# Patient Record
Sex: Female | Born: 1950 | Race: White | Hispanic: No | Marital: Married | Smoking: Never smoker
Health system: Southern US, Academic
[De-identification: ages and names within clinical notes are randomized; demographics above are authoritative.]

## PROBLEM LIST (undated history)

## (undated) DIAGNOSIS — R739 Hyperglycemia, unspecified: Secondary | ICD-10-CM

## (undated) DIAGNOSIS — K589 Irritable bowel syndrome without diarrhea: Secondary | ICD-10-CM

## (undated) DIAGNOSIS — E559 Vitamin D deficiency, unspecified: Secondary | ICD-10-CM

## (undated) DIAGNOSIS — H547 Unspecified visual loss: Secondary | ICD-10-CM

## (undated) DIAGNOSIS — F32A Depression, unspecified: Secondary | ICD-10-CM

## (undated) DIAGNOSIS — L259 Unspecified contact dermatitis, unspecified cause: Secondary | ICD-10-CM

## (undated) DIAGNOSIS — K5792 Diverticulitis of intestine, part unspecified, without perforation or abscess without bleeding: Secondary | ICD-10-CM

## (undated) DIAGNOSIS — I491 Atrial premature depolarization: Secondary | ICD-10-CM

## (undated) DIAGNOSIS — E7800 Pure hypercholesterolemia, unspecified: Secondary | ICD-10-CM

## (undated) DIAGNOSIS — I10 Essential (primary) hypertension: Secondary | ICD-10-CM

## (undated) DIAGNOSIS — F411 Generalized anxiety disorder: Secondary | ICD-10-CM

## (undated) DIAGNOSIS — E782 Mixed hyperlipidemia: Secondary | ICD-10-CM

## (undated) DIAGNOSIS — E119 Type 2 diabetes mellitus without complications: Secondary | ICD-10-CM

## (undated) DIAGNOSIS — H269 Unspecified cataract: Secondary | ICD-10-CM

## (undated) HISTORY — PX: HX CATARACT REMOVAL: SHX102

## (undated) HISTORY — DX: Generalized anxiety disorder: F41.1

## (undated) HISTORY — DX: Irritable bowel syndrome, unspecified: K58.9

## (undated) HISTORY — DX: Unspecified visual loss: H54.7

## (undated) HISTORY — DX: Vitamin D deficiency, unspecified: E55.9

## (undated) HISTORY — DX: Pure hypercholesterolemia, unspecified: E78.00

## (undated) HISTORY — PX: HX LAP CHOLECYSTECTOMY: SHX56

## (undated) HISTORY — DX: Unspecified contact dermatitis, unspecified cause: L25.9

## (undated) HISTORY — DX: Diverticulitis of intestine, part unspecified, without perforation or abscess without bleeding: K57.92

## (undated) HISTORY — DX: Unspecified cataract: H26.9

## (undated) HISTORY — PX: SHOULDER SURGERY: SHX246

## (undated) HISTORY — PX: HX PARTIAL HYSTERECTOMY: SHX80

## (undated) HISTORY — DX: Essential (primary) hypertension: I10

## (undated) HISTORY — DX: Hyperglycemia, unspecified: R73.9

## (undated) HISTORY — DX: Mixed hyperlipidemia: E78.2

## (undated) HISTORY — DX: Hypomagnesemia: E83.42

## (undated) HISTORY — PX: KNEE SURGERY: SHX244

## (undated) HISTORY — DX: Type 2 diabetes mellitus without complications: E11.9

## (undated) HISTORY — DX: Atrial premature depolarization: I49.1

## (undated) HISTORY — DX: Depression, unspecified: F32.A

## (undated) NOTE — Progress Notes (Signed)
 Formatting of this note might be different from the original.  Subjective   Patient ID: Jeanne [name] is a 68 y.o. female presenting to the Urgent Care with a chief complaint of Other (Pt. States she has a flutter in left ear x 2 months).    She has been hearing a fluttering sound in her left ear several times per day for the past 2 months or so. She feels like her hearing has been decreased in that ear as well. No ringing in ear. She does often also have dizziness but she figured that could be a medication side effect or just due to her age. No pain in ear, no tenderness, no drainage from ear, no headache, no nausea or vomiting, no fever or chills. No evaluation or treatment so far. No prior history of ear problems.    Objective   BP (!) 192/81 (BP Location: Right arm, Patient Position: Sitting, BP Cuff Size: Adult)   Pulse 65   Temp 36.3 C (97.4 F) (Temporal)   Resp 18   Ht 1.549 m (5' 1)   Wt 96.2 kg (212 lb)   SpO2 96%   BMI 40.06 kg/m     Physical Exam  Vitals and nursing note reviewed.   Constitutional:       General: She is not in acute distress.  HENT:      Right Ear: Hearing, tympanic membrane, ear canal and external ear normal.      Left Ear: Hearing, tympanic membrane, ear canal and external ear normal.      Mouth/Throat:      Lips: No lesions.   Eyes:      Extraocular Movements: Extraocular movements intact.      Conjunctiva/sclera: Conjunctivae normal.   Cardiovascular:      Rate and Rhythm: Normal rate.   Pulmonary:      Effort: Pulmonary effort is normal.   Skin:     Findings: No rash.   Neurological:      Mental Status: She is alert.         Assessment & Plan    Assessment & Plan  Hearing loss of left ear, unspecified hearing loss type    Orders:    Ambulatory referral to ENT; Future        In-House Lab Results:   No results found for this or any previous visit.     In-House Imaging Reads:        Procedure Documentation:  Procedures     ED Course & MDM   MDM - Medical Decision Making:  Differential diagnosis considered include: AOM, OE, cerumen impaction, foreign body, sensorineural hearing loss  Electronically signed by Lonni DELENA Potters, MD at 07/01/2024  4:31 PM EDT

---

## 1981-09-25 HISTORY — PX: HX HYSTERECTOMY: SHX81

## 2019-03-12 DIAGNOSIS — H2510 Age-related nuclear cataract, unspecified eye: Secondary | ICD-10-CM | POA: Insufficient documentation

## 2019-03-12 DIAGNOSIS — Z794 Long term (current) use of insulin: Secondary | ICD-10-CM | POA: Insufficient documentation

## 2019-03-12 DIAGNOSIS — H25041 Posterior subcapsular polar age-related cataract, right eye: Secondary | ICD-10-CM | POA: Insufficient documentation

## 2021-12-09 HISTORY — PX: INCONTINENCE SURGERY: SHX676

## 2022-01-03 ENCOUNTER — Encounter (RURAL_HEALTH_CENTER): Payer: Self-pay | Admitting: NURSE PRACTITIONER

## 2022-01-05 ENCOUNTER — Other Ambulatory Visit: Payer: Self-pay

## 2022-01-05 ENCOUNTER — Ambulatory Visit: Payer: Medicare Other | Attending: NURSE PRACTITIONER | Admitting: NURSE PRACTITIONER

## 2022-01-05 ENCOUNTER — Encounter (RURAL_HEALTH_CENTER): Payer: Self-pay | Admitting: NURSE PRACTITIONER

## 2022-01-05 VITALS — BP 132/76 | HR 85 | Temp 96.9°F | Resp 17 | Ht 61.0 in | Wt 227.0 lb

## 2022-01-05 DIAGNOSIS — Z961 Presence of intraocular lens: Secondary | ICD-10-CM | POA: Insufficient documentation

## 2022-01-05 DIAGNOSIS — H2640 Unspecified secondary cataract: Secondary | ICD-10-CM | POA: Insufficient documentation

## 2022-01-05 DIAGNOSIS — E782 Mixed hyperlipidemia: Secondary | ICD-10-CM | POA: Insufficient documentation

## 2022-01-05 DIAGNOSIS — Z7984 Long term (current) use of oral hypoglycemic drugs: Secondary | ICD-10-CM | POA: Insufficient documentation

## 2022-01-05 DIAGNOSIS — E1165 Type 2 diabetes mellitus with hyperglycemia: Secondary | ICD-10-CM | POA: Insufficient documentation

## 2022-01-05 DIAGNOSIS — Z794 Long term (current) use of insulin: Secondary | ICD-10-CM | POA: Insufficient documentation

## 2022-01-05 DIAGNOSIS — I252 Old myocardial infarction: Secondary | ICD-10-CM | POA: Insufficient documentation

## 2022-01-05 DIAGNOSIS — I10 Essential (primary) hypertension: Secondary | ICD-10-CM | POA: Insufficient documentation

## 2022-01-05 MED ORDER — INSULIN LISPRO (U-100) 100 UNIT/ML SUBCUTANEOUS PEN
PEN_INJECTOR | SUBCUTANEOUS | 1 refills | Status: DC
Start: 2022-01-05 — End: 2022-04-25

## 2022-01-05 MED ORDER — GVOKE HYPOPEN 2-PACK 1 MG/0.2 ML SUBCUTANEOUS AUTO-INJECTOR
AUTO-INJECTOR | SUBCUTANEOUS | 2 refills | Status: DC
Start: 2022-01-05 — End: 2022-02-28

## 2022-01-05 MED ORDER — PEN NEEDLE, DIABETIC 32 GAUGE X 5/32"
2 refills | Status: DC
Start: 2022-01-05 — End: 2022-07-27

## 2022-01-05 MED ORDER — INSULIN DEGLUDEC (U-200) 200 UNIT/ML (3 ML) SUBCUTANEOUS PEN
PEN_INJECTOR | SUBCUTANEOUS | 1 refills | Status: DC
Start: 2022-01-05 — End: 2022-04-25

## 2022-01-05 NOTE — Patient Instructions (Addendum)
-  Stop Glipizide  -Stop Humalog 75/25  -Start Tresiba 34 units once daily  -Start Humalog 7 units with meals, take 5-15 minutes before the meal or right when you sit down to eat  -Start Humalog correction scale with meals of 2 units for every 50 points your blood sugar is greater than 150, see scale below  -Check your sugar fasting, before meals, bedtime and as needed and record and send in glucose log to [place] in 2-3 weeks  -We will send in paperwork for personal Dexcom continuous glucose monitor  -Have labs drawn as ordered  -Follow up with Lowella Bandy in 3 months  -Call for any questions or concerns  -Call for 2 or more blood sugars less than 80 in one week, or any noted pattern of high or low blood sugars          Humalog mealtime scale  If your blood sugar is greater than 150 before the meal please use your prescribed fast acting insulin (NovoLog, Humalog, Apidra) for every 50 mg/dL above 767  Blood sugar:           150-200, 2 units                                  201-250, 4 units                                  251-300, 6 units                                  301-350, 8 units                                  351-400, 10 units                                  Greater than 400, 12 units        Hypoglycemia (Low Blood Sugar/ Under 80)  How to avoid hypoglycemia:  1. Eat meals and snacks on time  2. Check blood sugar regularly  3. Be mindful of activity and ALWAYS check blood sugar before activity/exercise and before driving  4. ALWAYS have a fast acting source of glucose with you at all times (glucose tablets, glucose gel, smarties, hard candy, or juice box)  TREATMENT  1. Test Blood Sugar at the fingertip - if unable to test blood sugar - treat like it is low  2. Treatment - Rule of 15 eat or drink 15 grams of fast acting carbs (example: 1/2 cup of juice or regular soda, 1 cup low fat or skim milk, 4 glucose tablets, 6 lifesavers)  *If unable to eat use glucagon injection AND call 911  3. Recheck blood sugar in 15  minutes  If blood sugar is less than 80- repeat rule of 15 (as above)  If blood sugar is greater than 80 - be mindful, continue checking blood sugar

## 2022-01-05 NOTE — Progress Notes (Signed)
[place] [facility]  FAMILY MEDICINE, Jeanne [name]  [address] [place]  [place] [address]  [phone]   Progress Note    Date of Service:  01/09/2022  Jeanne [name], Jeanne [name], 71 y.o. female  Date of Birth:  December 25, 1950  PCP: Jeanne [name]     HPI:  Jeanne [name] presents to Endocrinology clinic for a new consult for Type 2 diabetes complicated by other comorbid conditions including: hypertension, hyperlipidemia and obesity. Diagnosed with diabetes in 1997, at age 54. Family history is Positive for Type 2 diabetes- father.  Most recent A1c is 8.4 - indicative of suboptimal glycemic control.  Pt reports checking her blood sugars 2-4 times daily, the following is from pts recollection as she did not bring glucose log or glucometer with her:  Fasting: 140's-160s  Bedtime: 200s  Current diabetes medications include: Glipizide 10 mg once daily; Humalog 75/25 60 units Breakfast and 10 units PM  Does not  miss medications doses.  Denies difficulty affording medication and/or test strips.   Previous diabetes medication include: Metformin (nausea, diarrhea- even with extended release even with lower doses); Trulicity (nausea); Lantus (rash)  Has glucagon at home: No  Diabetes Education in the past: No RD in the past - No  Lives with: family  Meals:  Breakfast- eggs, potatoes, tomatoes, pancakes, toast-white bread, tea with sugar  Lunch- eating out a lot- burgers, salads  Dinner- vegetables, potatoes, some meat, pasta  Snacks- apples, celery, cheese, peanut butter, pepperoni, crackers  Drinks- Coke-1 16 oz bottle per day; water  Activity/Exercise: denies at this time, will start doing physical therapy shortly  Weight has: increased  Any hospitalizations r/t to diabetes over the last year: No  Chronic Complications:  Microvascular - no known history of retinopathy, nephropathy, positive for neuropathy   Macrovascular- positive hx of MI, negative for CVA and TIA  Autonomic  Dysfunction - denies gastroparesis, or bowel/bladder dysfunction  Denies foot ulcers, deformities. Does perform foot care at home - follows with podiatry in [place] every 3 months  Experiences hypoglycemia: Yes. Symptoms of hypoglycemia present during an event: lethargy, sweats and shakes Experiences hypoglycemia: rarely  Up to date with opthamology exam: Yes, 09/2021, Dr. Marinda Elk, [place], [place]  Steroids recently: No  History of frequent UTI: No  History of frequent genital yeast infection: No  History of pancreatitis: No  Personal or family Multiple Endocrine Neoplasia Type 2 (MEN2): No     Jeanne [name] is very interested in personal CGM with Dexcom which would be of great benefit to her.    Jeanne [name] is on 4 injections of insulin daily.  She requires personal CGM so that she can make the needed and frequent changes to her insulin regimen for improved glycemic control.    I have personally reviewed allergies, chief complaint, medications, past medical history, surgical history, and family history.  I have reviewed referral notes, PCP progress notes and labs relevant to this visit.    Past Medical History:   Diagnosis Date   . Clouded crystalline lens    . Contact dermatitis and eczema    . Depressive disorder    . Diverticulitis    . Generalized anxiety disorder    . High cholesterol    . HTN (hypertension)    . Hyperglycemia    . Hypomagnesemia    . IBS (irritable bowel syndrome)    . Mixed hyperlipidemia    . Premature atrial contraction    . T2DM (type 2  diabetes mellitus) (CMS HCC)    . Unspecified cataract    . Vision loss    . Vitamin D deficiency       Past Surgical History:   Procedure Laterality Date   . CESAREAN SECTION     . HX CATARACT REMOVAL     . HX HYSTERECTOMY  1983   . HX LAP CHOLECYSTECTOMY     . HX PARTIAL HYSTERECTOMY     . INCONTINENCE SURGERY  12/09/2021   . KNEE SURGERY      Knee replacements   . SHOULDER SURGERY        Social History     Tobacco Use   . Smoking status: Never     Passive exposure:  Never   . Smokeless tobacco: Never   Vaping Use   . Vaping Use: Never used   Substance Use Topics   . Alcohol use: Never   . Drug use: Never       Family Medical History:     Problem Relation (Age of Onset)    Anesthesia Complications Mother    Asthma Brother    Cancer Mother    Cervical Cancer Mother    Diabetes Father    Hypertension (High Blood Pressure) Father    Lung disease Father         Outpatient Medications Marked as Taking for the 01/05/22 encounter (Office Visit) with Jeanne China, APRN   Medication Sig   . celecoxib (CELEBREX) 200 mg Oral Capsule celecoxib 200 mg capsule   TAKE 1 CAPLET BY MOUTH EVERY MORNING - TAKE AFTER FOOD   . ergocalciferol, vitamin D2, (DRISDOL) 1,250 mcg (50,000 unit) Oral Capsule Vitamin D2 1,250 mcg (50,000 unit) capsule   Take 1 capsule every week by oral route for 84 days.   Marland Kitchen glucagon (GVOKE HYPOPEN 2-PACK) 1 mg/0.2 mL Subcutaneous Auto-Injector To inject IM or SubQ for severe hypoglycemia, may repeat in 15 minutes prn   . insulin degludec 200 unit/mL (3 mL) Subcutaneous Insulin Pen Inject 34 units once daily, adjust as directed, MDD 60   . insulin lispro (HUMALOG KWIKPEN INSULIN) 100 unit/mL Subcutaneous Insulin Pen Inject 7 units with meals plus correction 2 units for every 50 greater than 150 with meals, MDD 50   . losartan (COZAAR) 50 mg Oral Tablet Take 1 Tablet (50 mg total) by mouth Once a day   . magnesium oxide (MAG-OX) 400 mg Oral Tablet daily   . Pen Needle, Disposable, 32 gauge x 5/32" Needle To use 4 times daily with insulin   . sertraline (ZOLOFT) 100 mg Oral Tablet sertraline 100 mg tablet   . torsemide (DEMADEX) 10 mg Oral Tablet Take 1 Tablet (10 mg total) by mouth Once a day      Allergies   Allergen Reactions   . Penicillins Anaphylaxis   . Insulin Glargine Rash and Hives/ Urticaria   . Statins-Hmg-Coa Reductase Inhibitors Diarrhea, Nausea/ Vomiting and Myalgia     Duricef(Cefadroxil), Trulicity Solution Pen Injector.        Review of Systems:  Review of  Systems   Constitutional: Negative for malaise/fatigue.   Eyes: Negative for blurred vision.   Respiratory: Negative for shortness of breath.    Cardiovascular: Negative for chest pain and leg swelling.   Gastrointestinal: Negative for abdominal pain, diarrhea, nausea and vomiting.   Genitourinary: Negative for frequency.   Endo/Heme/Allergies: Negative for polydipsia.          BP 132/76   Pulse 85  Temp 36.1 C (96.9 F) (Temporal)   Resp 17   Ht 1.549 m (5\' 1" )   Wt 103 kg (227 lb)   SpO2 95%   BMI 42.89 kg/m       Physical Exam  Vitals reviewed.   Constitutional:       Appearance: Normal appearance. She is obese.   HENT:      Head: Normocephalic.   Neck:      Vascular: No carotid bruit.   Cardiovascular:      Rate and Rhythm: Normal rate and regular rhythm.   Pulmonary:      Effort: Pulmonary effort is normal.      Breath sounds: Normal breath sounds.   Musculoskeletal:      Cervical back: Neck supple.   Skin:     General: Skin is warm and dry.   Neurological:      General: No focal deficit present.      Mental Status: She is alert and oriented to person, place, and time.            Data Reviewed:    Outside Lab Results 09/23/21:  HgbA1C; 8.4  GFR: 100  GFR: 0.5  Albumin/creatinine: 56.3    Outside Lab Results 04/14/21:  LDL: 132  Triglycerides: 117  TSH: 2.48    Assessment/Plan:  Assessment/Plan   1. Type 2 diabetes mellitus with hyperglycemia, with long-term current use of insulin (CMS HCC)    2. Essential hypertension    3. Mixed hyperlipidemia         ICD-10-CM    1. Type 2 diabetes mellitus with hyperglycemia, with long-term current use of insulin (CMS HCC)  E11.65 insulin degludec 200 unit/mL (3 mL) Subcutaneous Insulin Pen    Z79.4 insulin lispro (HUMALOG KWIKPEN INSULIN) 100 unit/mL Subcutaneous Insulin Pen     Pen Needle, Disposable, 32 gauge x 5/32" Needle     MICROALBUMIN/CREATININE RATIO, URINE, RANDOM     COMPREHENSIVE METABOLIC PANEL, NON-FASTING     HGA1C (HEMOGLOBIN A1C WITH EST AVG  GLUCOSE)     LIPID PANEL     glucagon (GVOKE HYPOPEN 2-PACK) 1 mg/0.2 mL Subcutaneous Auto-Injector      2. Essential hypertension  I10       3. Mixed hyperlipidemia  E78.2          Orders Placed This Encounter   . MICROALBUMIN/CREATININE RATIO, URINE, RANDOM   . COMPREHENSIVE METABOLIC PANEL, NON-FASTING   . HGA1C (HEMOGLOBIN A1C WITH EST AVG GLUCOSE)   . LIPID PANEL   . insulin degludec 200 unit/mL (3 mL) Subcutaneous Insulin Pen   . insulin lispro (HUMALOG KWIKPEN INSULIN) 100 unit/mL Subcutaneous Insulin Pen   . Pen Needle, Disposable, 32 gauge x 5/32" Needle   . glucagon (GVOKE HYPOPEN 2-PACK) 1 mg/0.2 mL Subcutaneous Auto-Injector      Return in about 3 months (around 04/06/2022).    Type 2 diabetes - reviewed labs with patient. Discussed difficulty in controlling diabetes with mixed insulin.  She is agreeable to trying basal/bolus therapy.  Will stop Glipizide.  Start Tresiba 34 units once daily.  Start Humalog 7 units with meals plus correction 2:50 >150 AC.  To check glucose fasting, AC, HS and prn.  Send in glucose log in 2-3 weeks.  Will send in for personal Dexcom.  Hypertension - at goal, continue current regimen and continue to monitor  Hyperlipidemia - LDL not at goal, intolerant to statins d/t myalgias  Neuropathy - reviewed importance of foot care and proper  shoes, follows with podiatry regularly  Obesity - benefits of weight loss with emphasis on lowering insulin resistance reviewed, recommend following meal plan and increasing activity      On the day of the encounter, a total of  60 minutes was spent on this patient encounter including review of historical information, examination, documentation and post-visit activities.      This note may have been partially generated using MModal Fluency Direct system, and there may be some incorrect words, spellings, and punctuation that were not noted in checking the note before saving.    Jeanne China, APRN

## 2022-01-09 ENCOUNTER — Encounter (RURAL_HEALTH_CENTER): Payer: Self-pay | Admitting: NURSE PRACTITIONER

## 2022-02-28 ENCOUNTER — Other Ambulatory Visit (RURAL_HEALTH_CENTER): Payer: Self-pay | Admitting: NURSE PRACTITIONER

## 2022-02-28 DIAGNOSIS — Z794 Long term (current) use of insulin: Secondary | ICD-10-CM

## 2022-02-28 NOTE — Telephone Encounter (Signed)
Patient needs glucagon sent to [facility].  Nmt, lpn

## 2022-03-01 ENCOUNTER — Other Ambulatory Visit (RURAL_HEALTH_CENTER): Payer: Self-pay | Admitting: NURSE PRACTITIONER

## 2022-03-01 DIAGNOSIS — E1165 Type 2 diabetes mellitus with hyperglycemia: Secondary | ICD-10-CM

## 2022-03-01 MED ORDER — GLUCAGON EMERGENCY KIT 1 MG SOLUTION FOR INJECTION
INTRAMUSCULAR | 2 refills | Status: DC
Start: 2022-03-01 — End: 2024-06-24

## 2022-03-06 IMAGING — MR MRI PELVIS WITHOUT CONTRAST
4 of 6 series · 22 of 48 positions shown · IV contrast (gadolinium)
Comparison: None available.

﻿EXAM:  [id number]   MRI PELVIS WITHOUT CONTRAST
INDICATION: Hip pain.
TECHNIQUE: Multiplanar multisequential MRI of the pelvis was performed without gadolinium contrast.

[Series 7: T2 fat-sat · axial · 4.9mm · 0.80mm/px · z∈[-27,+161]mm · 8 of 36 slices shown]
[im 1/36]
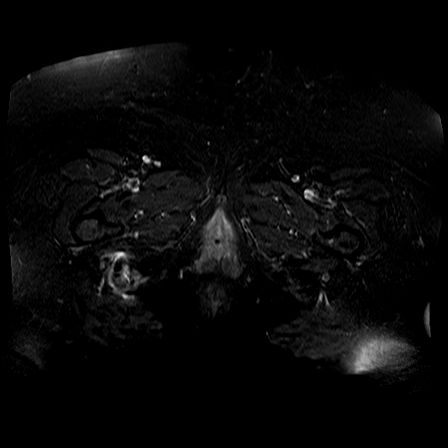
[im 4/36]
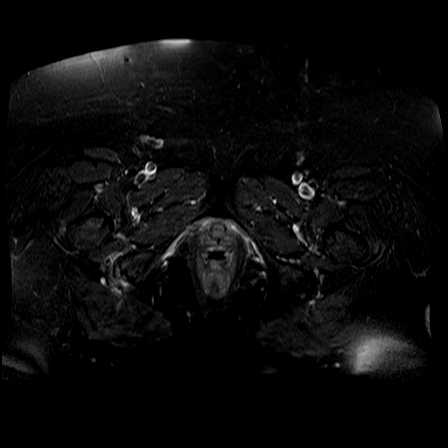
[im 12/36]
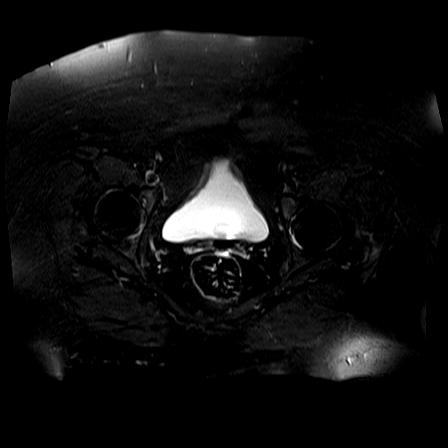
[im 16/36]
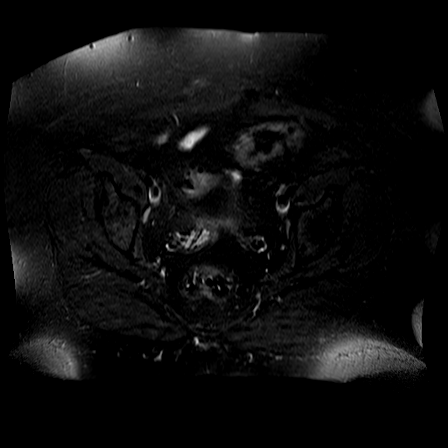
[im 20/36]
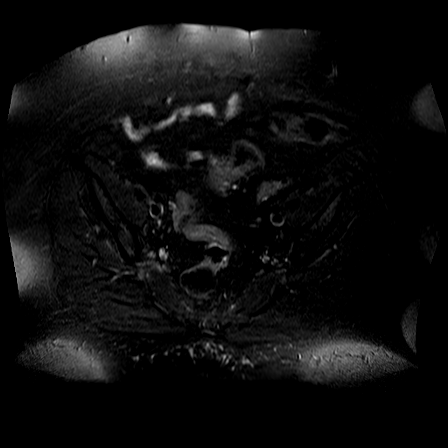
[im 24/36]
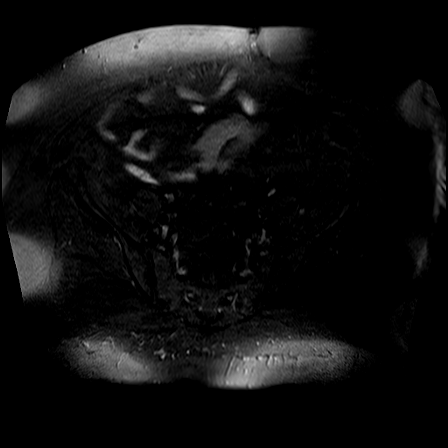
[im 32/36]
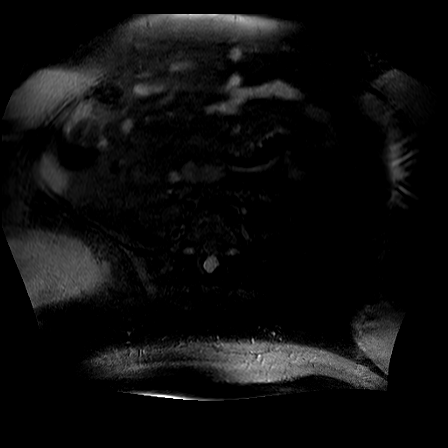
[im 36/36]
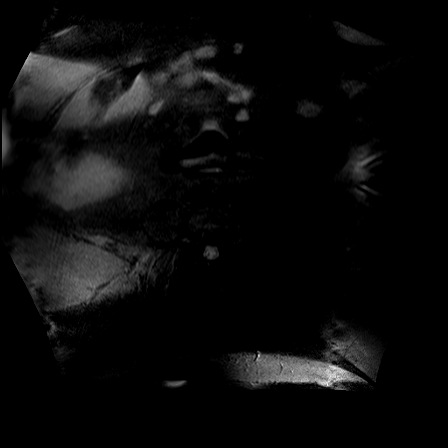

[Series 8: T1 · axial · 4.9mm · 0.70mm/px · z∈[-27,+134]mm · 8 of 36 slices shown (1 of 2)]
[im 1/36]
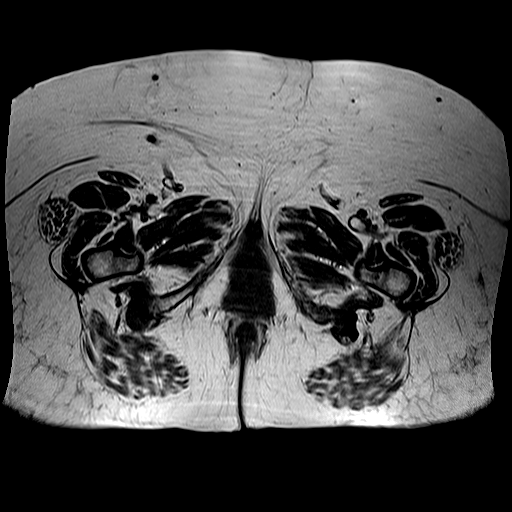
[im 5/36]
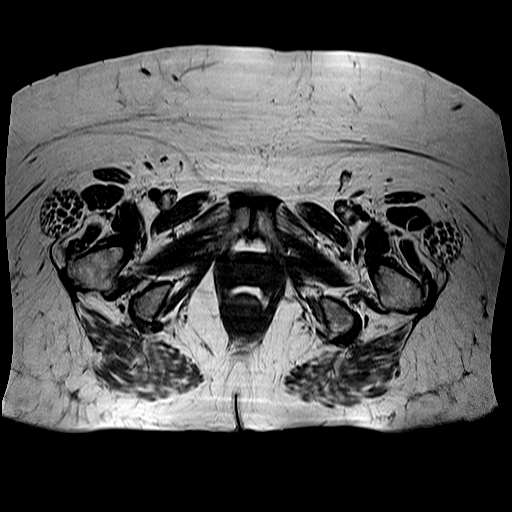
[im 9/36]
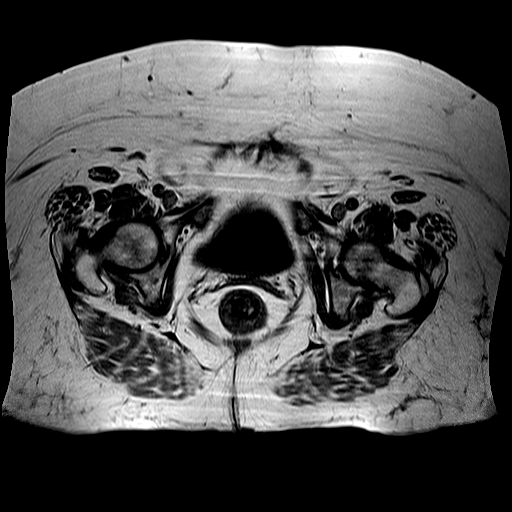
[im 14/36]
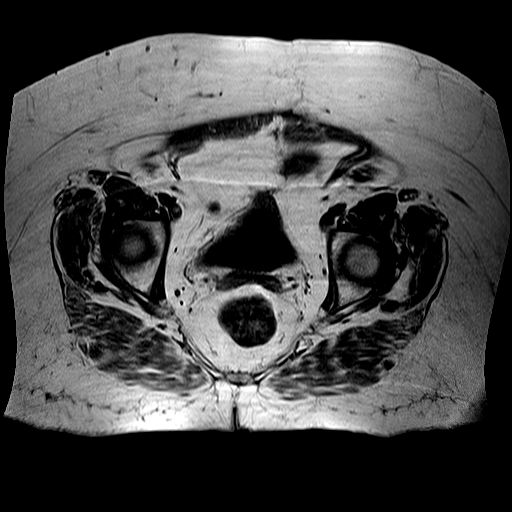
[im 18/36]
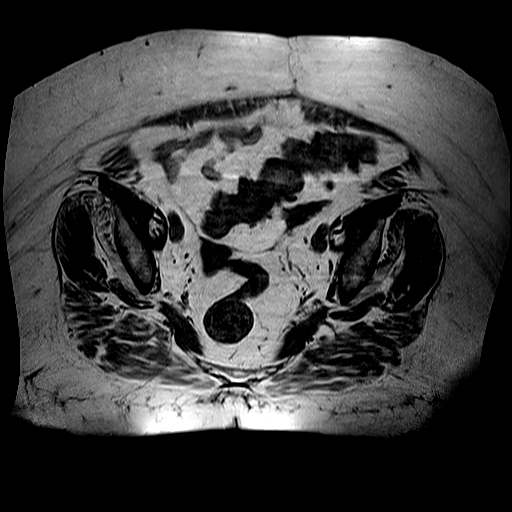
[im 22/36]
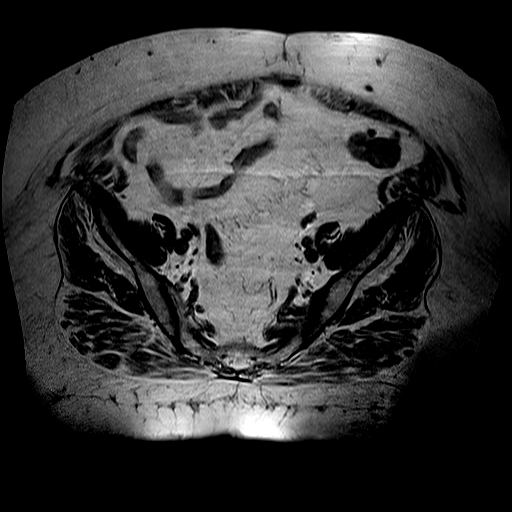
[im 27/36]
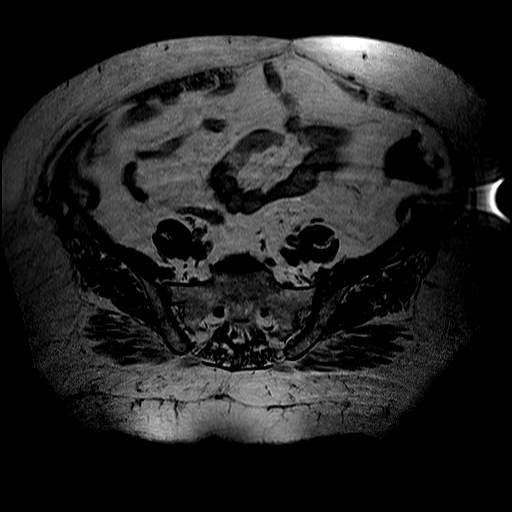
[im 31/36]
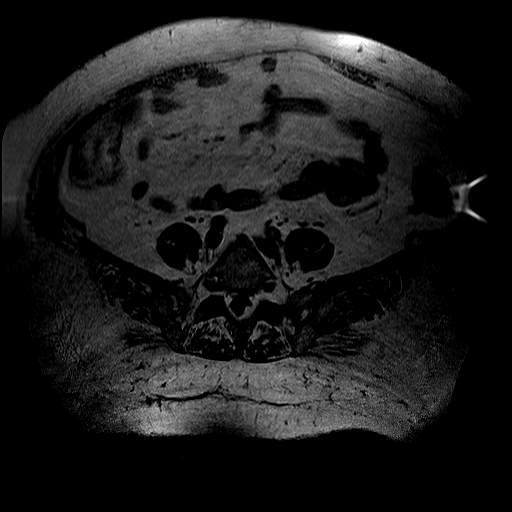

[Series 9: STIR · axial · 4.9mm · 0.70mm/px · z∈[-6,+134]mm · 3 of 36 slices shown]
[im 5/36]
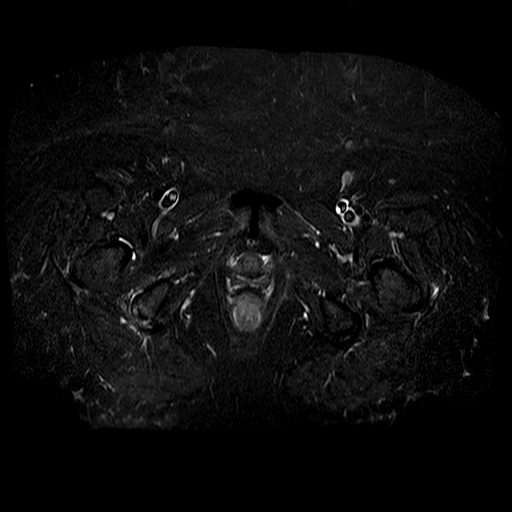
[im 18/36]
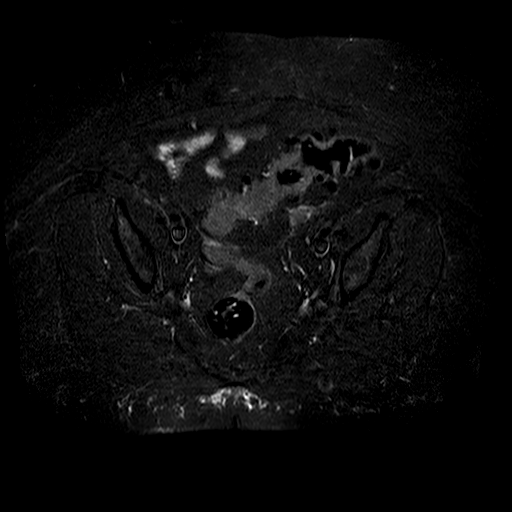
[im 31/36]
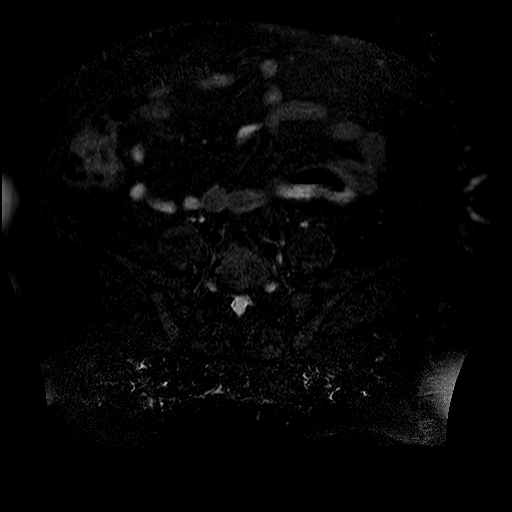

[Series 10: T1 · coronal · 6.0mm · 0.70mm/px · 3 of 26 slices shown (2 of 2)]
[im 6/26]
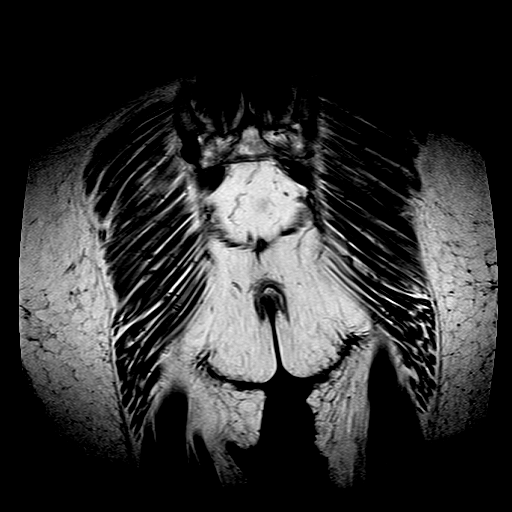
[im 16/26]
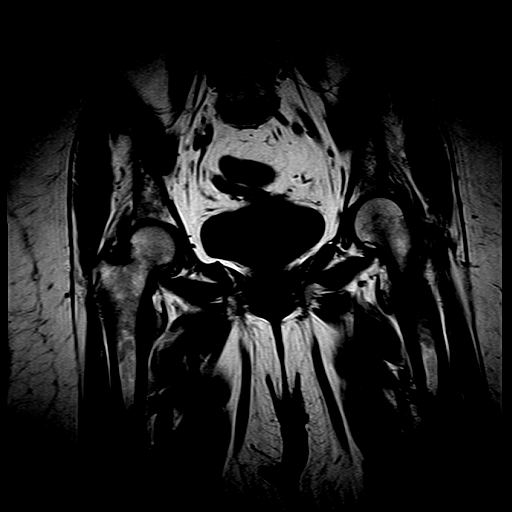
[im 26/26]
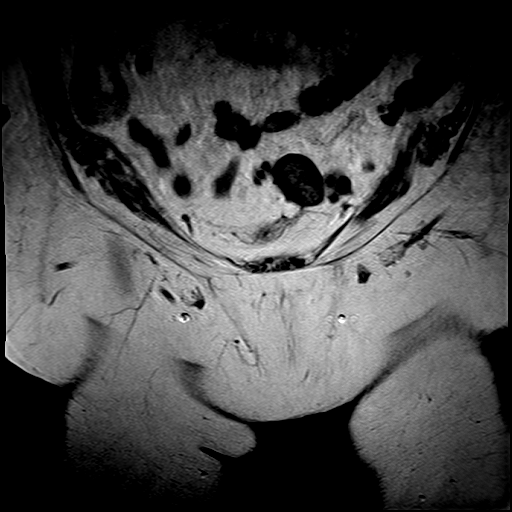

[22 of 48 positions shown; findings below may reference images not displayed]

FINDINGS: Bone marrow signal intensity is normal.  There is no acute fracture or subluxation.  Hip joints are well maintained.  There is no significant hip effusion on either side.  There is mild-to-moderate bilateral greater trochanteric bursitis.  There is severe right high hamstring tendinopathy.  Evaluation of pelvic parenchymal structures is limited without definite abnormality.
IMPRESSION: 1. Severe right high hamstring tendinopathy.  

2. Mild-to-moderate bilateral greater trochanteric bursitis.

## 2022-03-06 IMAGING — MR MRI LUMBAR SPINE WITHOUT CONTRAST
5 of 6 series · 32 of 48 positions shown · IV contrast (gadolinium)
Comparison: None available.

﻿EXAM:  [id number]   MRI LUMBAR SPINE WITHOUT CONTRAST
INDICATION: Lower back pain with radiculopathy.
TECHNIQUE: Multiplanar multisequential MRI of the lumbar spine was performed without gadolinium contrast.

[Series 5: T2 · sagittal · 4.0mm · 0.94mm/px · 6 of 13 slices shown (1 of 3)]
[im 1/13]
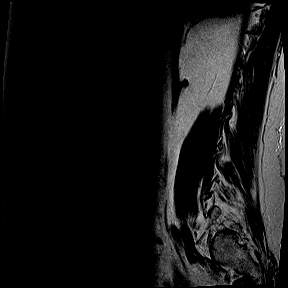
[im 3/13]
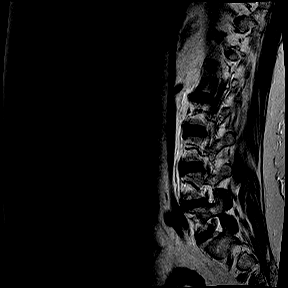
[im 5/13]
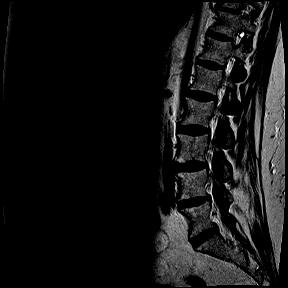
[im 8/13]
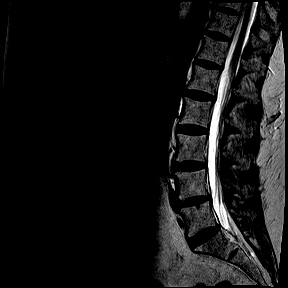
[im 10/13]
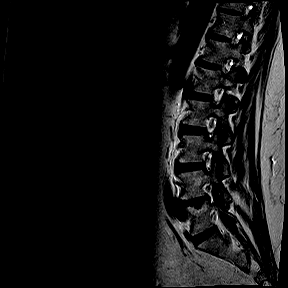
[im 13/13]
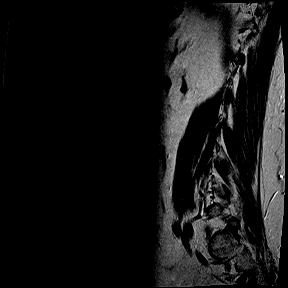

[Series 6: T1 · sagittal · 4.0mm · 0.94mm/px · 6 of 13 slices shown (1 of 2)]
[im 1/13]
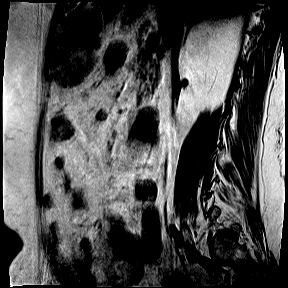
[im 3/13]
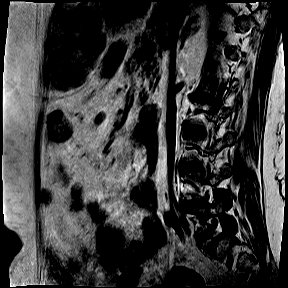
[im 5/13]
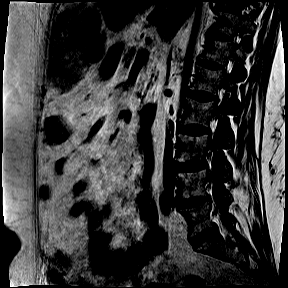
[im 8/13]
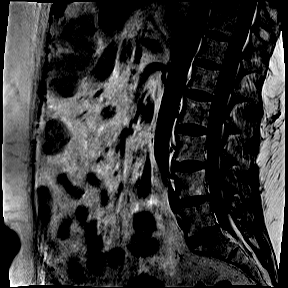
[im 10/13]
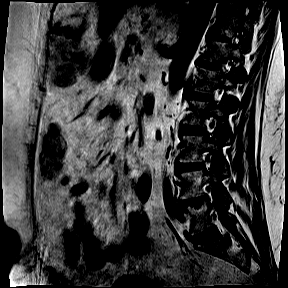
[im 13/13]
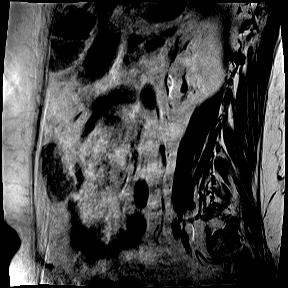

[Series 7: T2 · axial · 4.0mm · 0.52mm/px · z∈[-131,+65]mm · 11 of 23 slices shown (2 of 3)]
[im 1/23]
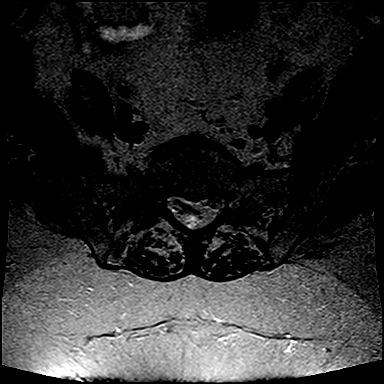
[im 3/23]
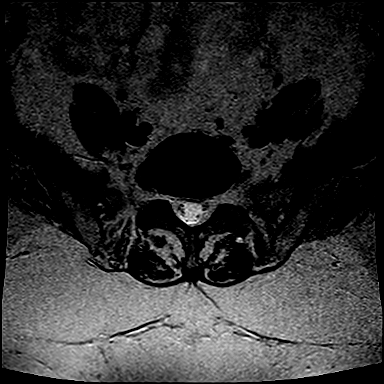
[im 5/23]
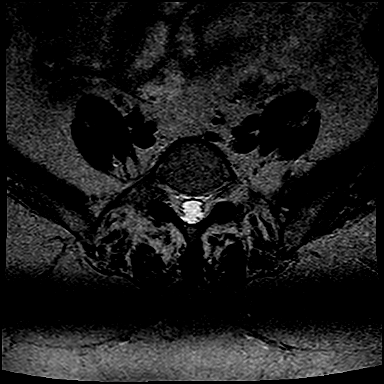
[im 7/23]
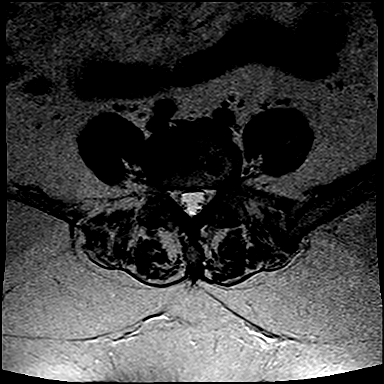
[im 9/23]
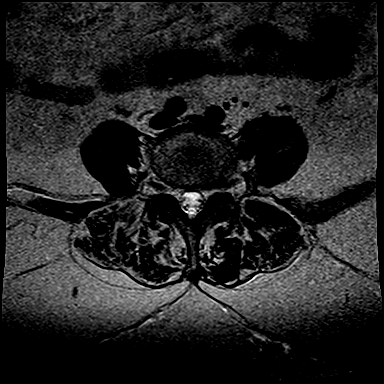
[im 12/23]
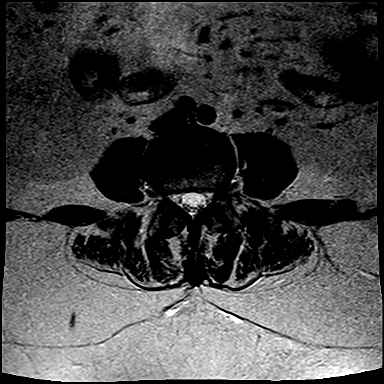
[im 14/23]
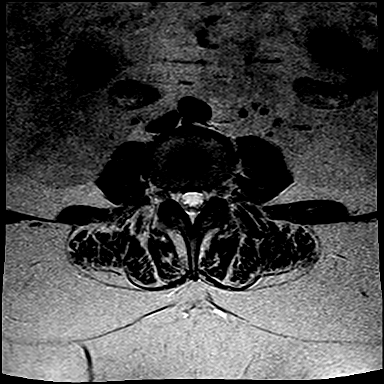
[im 16/23]
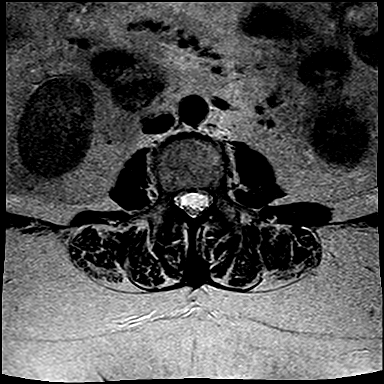
[im 18/23]
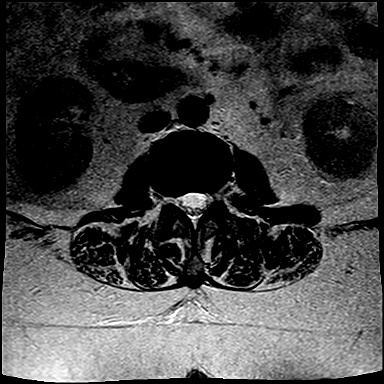
[im 20/23]
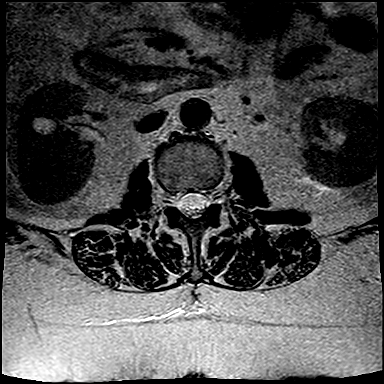
[im 23/23]
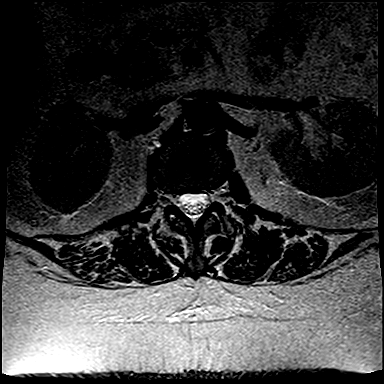

[Series 8: T1 · axial · 4.0mm · 0.52mm/px · 1 of 23 slices shown (2 of 2)]
[im 1/23]
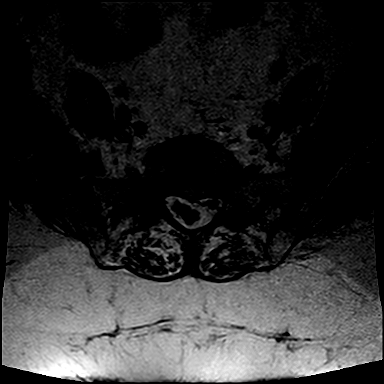

[Series 10: T2 · coronal · 5.0mm · 0.82mm/px · 8 of 18 slices shown (3 of 3)]
[im 1/18]
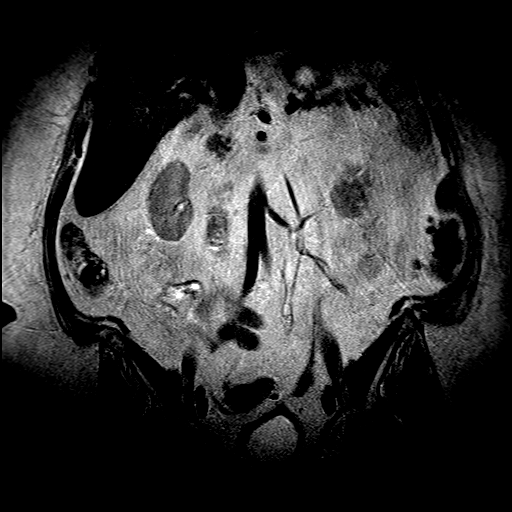
[im 3/18]
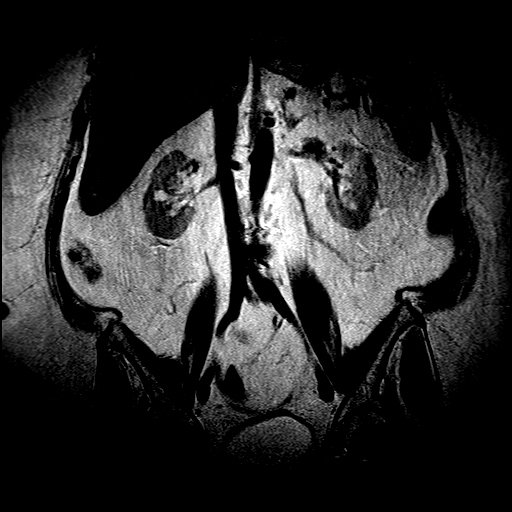
[im 5/18]
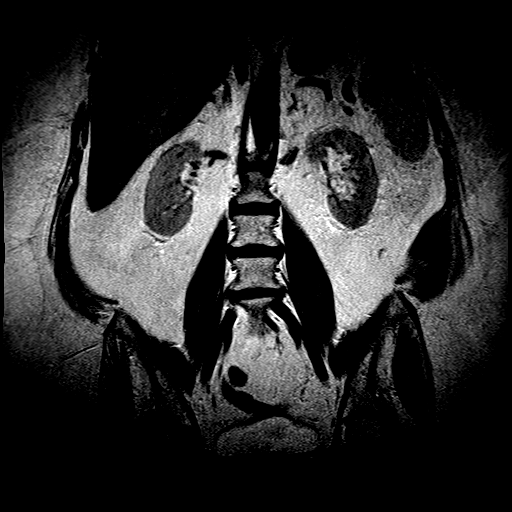
[im 8/18]
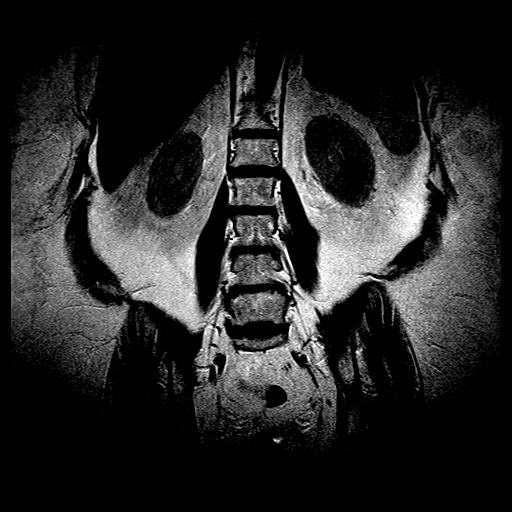
[im 10/18]
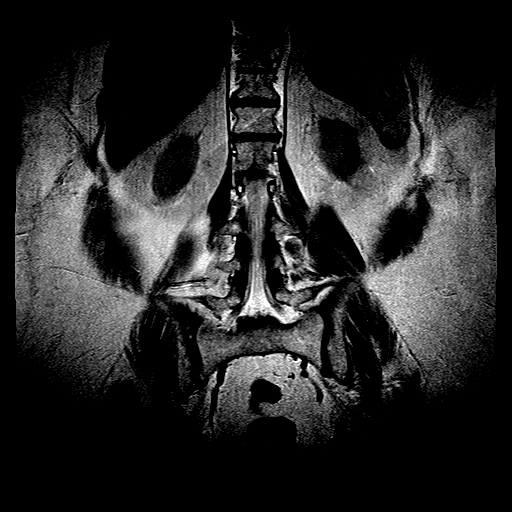
[im 13/18]
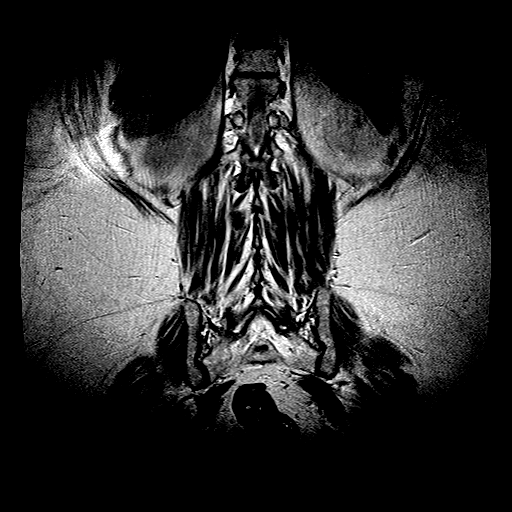
[im 15/18]
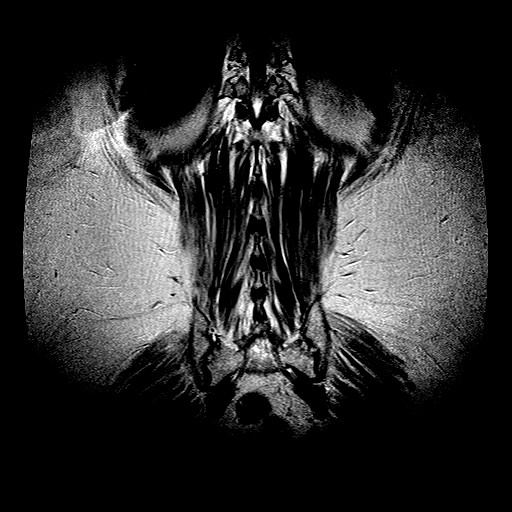
[im 18/18]
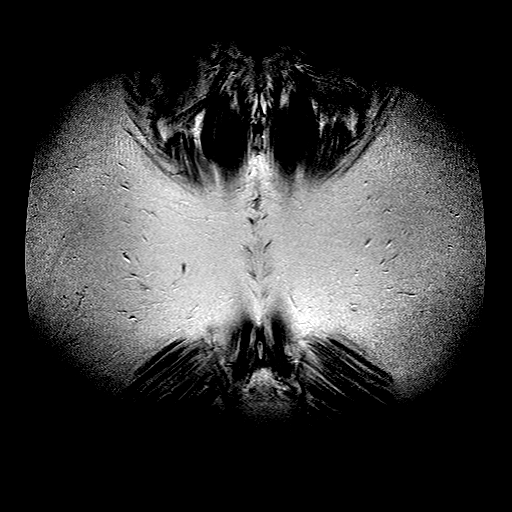

[32 of 48 positions shown; findings below may reference images not displayed]

FINDINGS: Vertebral bodies are normal in height, alignment and signal intensity. There is no acute fracture or subluxation. Distal spinal cord is normal in signal intensity and terminates normally at L1 vertebral body level. Spinal canal is congenitally narrow. 

At L1-2 level, there is a small broad-based central disc bulge mildly effacing the ventral thecal sac.  There is no significant neural foraminal stenosis. 

L2-3 level is unremarkable. 

At L3-4 level, there is mild bilateral neural foraminal stenosis from facet arthropathy and bulging annulus without nerve root impingement. 

At L4-5 level, there is a minimal bulging annulus, minimally effacing the ventral thecal sac.  There is mild-to-moderate bilateral neural foraminal stenosis from facet arthropathy and bulging annulus. 

At L5-S1 level, there is moderate right neural foraminal stenosis from facet arthropathy and bulging annulus. 

Paraspinal soft tissues are unremarkable.
IMPRESSION: 1. No significant disc herniation or spinal stenosis at any level. 

2. Multilevel neural foraminal stenosis as detailed above.

## 2022-03-22 DIAGNOSIS — N3946 Mixed incontinence: Secondary | ICD-10-CM | POA: Insufficient documentation

## 2022-03-22 DIAGNOSIS — J449 Chronic obstructive pulmonary disease, unspecified: Secondary | ICD-10-CM | POA: Insufficient documentation

## 2022-04-20 ENCOUNTER — Other Ambulatory Visit (RURAL_HEALTH_CENTER): Payer: Self-pay | Admitting: NURSE PRACTITIONER

## 2022-04-20 NOTE — Telephone Encounter (Deleted)
Patient request refill. Nmt, lpn

## 2022-04-25 ENCOUNTER — Other Ambulatory Visit: Payer: Medicare Other | Attending: NURSE PRACTITIONER

## 2022-04-25 ENCOUNTER — Ambulatory Visit: Payer: Medicare Other | Attending: NURSE PRACTITIONER | Admitting: NURSE PRACTITIONER

## 2022-04-25 ENCOUNTER — Other Ambulatory Visit: Payer: Self-pay

## 2022-04-25 ENCOUNTER — Encounter (RURAL_HEALTH_CENTER): Payer: Self-pay | Admitting: NURSE PRACTITIONER

## 2022-04-25 VITALS — BP 140/92 | HR 83 | Temp 96.8°F | Resp 17 | Ht 61.0 in | Wt 229.0 lb

## 2022-04-25 DIAGNOSIS — E1165 Type 2 diabetes mellitus with hyperglycemia: Secondary | ICD-10-CM | POA: Insufficient documentation

## 2022-04-25 DIAGNOSIS — E1140 Type 2 diabetes mellitus with diabetic neuropathy, unspecified: Secondary | ICD-10-CM | POA: Insufficient documentation

## 2022-04-25 DIAGNOSIS — E669 Obesity, unspecified: Secondary | ICD-10-CM | POA: Insufficient documentation

## 2022-04-25 DIAGNOSIS — Z794 Long term (current) use of insulin: Secondary | ICD-10-CM | POA: Insufficient documentation

## 2022-04-25 DIAGNOSIS — I10 Essential (primary) hypertension: Secondary | ICD-10-CM | POA: Insufficient documentation

## 2022-04-25 DIAGNOSIS — E782 Mixed hyperlipidemia: Secondary | ICD-10-CM | POA: Insufficient documentation

## 2022-04-25 DIAGNOSIS — Z6841 Body Mass Index (BMI) 40.0 and over, adult: Secondary | ICD-10-CM | POA: Insufficient documentation

## 2022-04-25 LAB — COMPREHENSIVE METABOLIC PANEL, NON-FASTING
ALBUMIN: 3.6 g/dL (ref 3.4–4.8)
ALKALINE PHOSPHATASE: 80 U/L (ref 55–145)
ALT (SGPT): 12 U/L (ref 8–22)
ANION GAP: 8 mmol/L (ref 4–13)
AST (SGOT): 12 U/L (ref 8–45)
BILIRUBIN TOTAL: 0.6 mg/dL (ref 0.3–1.3)
BUN/CREA RATIO: 18 (ref 6–22)
BUN: 13 mg/dL (ref 8–25)
CALCIUM: 9.8 mg/dL (ref 8.8–10.2)
CHLORIDE: 102 mmol/L (ref 96–111)
CO2 TOTAL: 30 mmol/L (ref 23–31)
CREATININE: 0.74 mg/dL (ref 0.60–1.05)
ESTIMATED GFR: 86 mL/min/BSA (ref >=60)
GLUCOSE: 164 mg/dL — ABNORMAL HIGH (ref 65–125)
POTASSIUM: 4.4 mmol/L (ref 3.5–5.1)
PROTEIN TOTAL: 7.3 g/dL (ref 6.0–8.0)
SODIUM: 140 mmol/L (ref 136–145)

## 2022-04-25 LAB — LIPID PANEL
CHOL/HDL RATIO: 4.2
CHOLESTEROL: 222 mg/dL — ABNORMAL HIGH (ref 100–200)
HDL CHOL: 53 mg/dL (ref >=50)
LDL CALC: 148 mg/dL — ABNORMAL HIGH (ref <100)
NON-HDL: 169 mg/dL (ref <=190)
TRIGLYCERIDES: 119 mg/dL (ref <150)
VLDL CALC: 22 mg/dL (ref <30)

## 2022-04-25 LAB — THYROID STIMULATING HORMONE (SENSITIVE TSH): TSH: 1.254 u[IU]/mL (ref 0.430–3.550)

## 2022-04-25 LAB — MICROALBUMIN/CREATININE RATIO, URINE, RANDOM
CREATININE RANDOM URINE: 84 mg/dL (ref 50–100)
MICROALBUMIN RANDOM URINE: 3.1 mg/dL
MICROALBUMIN/CREATININE RATIO RANDOM URINE: 36.9 mg/g — ABNORMAL HIGH (ref <30.0)

## 2022-04-25 LAB — HGA1C (HEMOGLOBIN A1C WITH EST AVG GLUCOSE)
ESTIMATED AVERAGE GLUCOSE: 214 mg/dL
HEMOGLOBIN A1C: 9.1 % — ABNORMAL HIGH (ref 4.0–6.0)

## 2022-04-25 MED ORDER — INSULIN LISPRO (U-100) 100 UNIT/ML SUBCUTANEOUS PEN
PEN_INJECTOR | SUBCUTANEOUS | 1 refills | Status: DC
Start: 2022-04-25 — End: 2022-07-27

## 2022-04-25 MED ORDER — INSULIN DEGLUDEC (U-200) 200 UNIT/ML (3 ML) SUBCUTANEOUS PEN
PEN_INJECTOR | SUBCUTANEOUS | 1 refills | Status: DC
Start: 2022-04-25 — End: 2022-07-27

## 2022-04-25 NOTE — Patient Instructions (Signed)
-  decrease Tresiba to 34 units once daily  -continue Humalog 7 units with breakfast and increase to 9 units with dinner.  Take these doses if glucose is 100-150 before the meal if blood sugar is 80-100 before the meal cut these doses in half.  If glucose is less than 80 before the meal skip dose  -continue Humalog correction scale with meals if blood sugar is higher than 150 at the meal  Continue Dexcom use  -have labs drawn as ordered  -follow up with Lowella Bandy in 3 months  -Call for any questions or concerns  -Call for 2 or more blood sugars less than 80 in one week, or any noted pattern of high or low blood sugars      Meal time correction scale  If your blood sugar is greater than 150 before the meal please use your prescribed fast acting insulin, in addition to your meal time dose, (NovoLog, Humalog, Apidra) for every 50 mg/dL above 197  Blood sugar:           150-200, 2 units                                  201-250, 4 units                                  251-300, 6 units                                  301-350, 8 units                                  351-400, 10 units                                  Greater than 400, 12 units      Hypoglycemia (Low Blood Sugar/ Under 80)  How to avoid hypoglycemia:  1. Eat meals and snacks on time  2. Check blood sugar regularly  3. Be mindful of activity and ALWAYS check blood sugar before activity/exercise and before driving  4. ALWAYS have a fast acting source of glucose with you at all times (glucose tablets, glucose gel, smarties, hard candy, or juice box)  TREATMENT  1. Test Blood Sugar at the fingertip - if unable to test blood sugar - treat like it is low  2. Treatment - Rule of 15 eat or drink 15 grams of fast acting carbs (example: 1/2 cup of juice or regular soda, 1 cup low fat or skim milk, 4 glucose tablets, 6 lifesavers)  *If unable to eat use glucagon injection AND call 911  3. Recheck blood sugar in 15 minutes  If blood sugar is less than 80- repeat rule of 15  (as above)  If blood sugar is greater than 80 - be mindful, continue checking blood sugar

## 2022-04-25 NOTE — Progress Notes (Signed)
[place] [facility]  [facility], [facility]  [address]  [place] [address]  [phone]   Progress Note    Date of Service:  04/25/2022  Jeanne, [name], 71 y.o. female  Date of Birth:  [birth year]  PCP: Adriana Simas, DO     HPI:  Jeanne [name] presents to Endocrinology clinic for a follow-up for Type 2 diabetes complicated by other comorbid conditions including: hypertension, hyperlipidemia and obesity. Diagnosed with diabetes in 1997, at age 36. Family history is Positive for Type 2 diabetes- father.  Most recent A1c is 8.4 - indicative of suboptimal glycemic control.  Review of Dexcom download for the dates of April 12, 2022 to April 25, 2022 reveals the following:  Average glucose:  191  GMI:  7.9%  In range:  50%  High: 32%  Very high: 17%  Low: Less than 1%  Very low: 0%  Current diabetes medications include:  Tresiba 36 units once daily; Humalog 7 units with meals plus correction 2:50> 150  Does not  miss medications doses.  Denies difficulty affording medication and/or test strips.   Previous diabetes medication include: Metformin (nausea, diarrhea- even with extended release even with lower doses); Trulicity (nausea); Lantus (rash): Glipizide; Humalog 75/25  Has glucagon at home: Yes  Diabetes Education in the past: No RD in the past - No  Lives with: family  Weight has: increased  Any hospitalizations r/t to diabetes over the last year: No  Chronic Complications:  Microvascular - no known history of retinopathy, nephropathy, positive for neuropathy   Macrovascular- positive hx of MI, negative for CVA and TIA  Autonomic Dysfunction - denies gastroparesis, or bowel/bladder dysfunction  Denies foot ulcers, deformities. Does perform foot care at home - follows with podiatry in [place] every 3 months  Experiences hypoglycemia: Yes. Symptoms of hypoglycemia present during an event: lethargy, sweats and shakes Experiences hypoglycemia:  rarely  Up to date with opthamology exam: Yes, 09/2021, Dr. Marinda Elk, [place], [place]  Steroids recently: No  History of frequent UTI: No  History of frequent genital yeast infection: No  History of pancreatitis: No  Personal or family Multiple Endocrine Neoplasia Type 2 (MEN2): No     Treatment has transitioned well from mixed insulin dosing to basal bolus.  Blood sugars do seem to have slightly improved however she is having postprandial hyperglycemia.  Patient's daughter reports that she went ahead and have patient increase her Evaristo Bury to 36 units once daily because she was having higher blood sugars since she did not feel about what hurt anything.  They were also confused about Humalog dosing if her glucose has been less than 150 prior to meals she has not been taking any Humalog, they report that this happens very frequently and this is likely resulting to her postprandial hyperglycemia.  She has had a few episodes of nocturnal hypoglycemia.  She did have her labs drawn after last appointment with her PCP office however they did not fax [place] those results we will call in attempt to obtain.    I have personally reviewed allergies, chief complaint, medications, past medical history, surgical history, and family history.  I have reviewed referral notes, PCP progress notes and labs relevant to this visit.    Past Medical History:   Diagnosis Date    Clouded crystalline lens     Contact dermatitis and eczema     Depressive disorder     Diverticulitis  Generalized anxiety disorder     High cholesterol     HTN (hypertension)     Hyperglycemia     Hypomagnesemia     IBS (irritable bowel syndrome)     Mixed hyperlipidemia     Premature atrial contraction     T2DM (type 2 diabetes mellitus) (CMS HCC)     Unspecified cataract     Vision loss     Vitamin D deficiency       Past Surgical History:   Procedure Laterality Date    CESAREAN SECTION      HX CATARACT REMOVAL      HX HYSTERECTOMY  1983    HX LAP CHOLECYSTECTOMY      HX  PARTIAL HYSTERECTOMY      INCONTINENCE SURGERY  12/09/2021    KNEE SURGERY      Knee replacements    SHOULDER SURGERY        Social History     Tobacco Use    Smoking status: Never     Passive exposure: Never    Smokeless tobacco: Never   Vaping Use    Vaping Use: Never used   Substance Use Topics    Alcohol use: Never    Drug use: Never       Family Medical History:       Problem Relation (Age of Onset)    Anesthesia Complications Mother    Asthma Brother    Cancer Mother    Cervical Cancer Mother    Diabetes Father    Hypertension (High Blood Pressure) Father    Lung disease Father           Outpatient Medications Marked as Taking for the 04/25/22 encounter (Office Visit) with [name], APRN   Medication Sig    celecoxib (CELEBREX) 200 mg Oral Capsule celecoxib 200 mg capsule   TAKE 1 CAPLET BY MOUTH EVERY MORNING - TAKE AFTER FOOD    clotrimazole-betamethasone (LOTRISONE) 1-0.05 % Cream APPLY TOPICALLY TO THE AFFECTED AND SURROUNDING AREAS TWICE DAILY IN THE MORNING AND IN THE EVENING FOR 2 WEEKS    ergocalciferol, vitamin D2, (DRISDOL) 1,250 mcg (50,000 unit) Oral Capsule Vitamin D2 1,250 mcg (50,000 unit) capsule   Take 1 capsule every week by oral route for 84 days.    insulin degludec 200 unit/mL (3 mL) Subcutaneous Insulin Pen Inject 34 units once daily, adjust as directed, MDD 60    insulin lispro (HUMALOG KWIKPEN INSULIN) 100 unit/mL Subcutaneous Insulin Pen Inject 7 units with breakfast and 9 units dinner plus correction 2 units for every 50 greater than 150 with meals, MDD 50    losartan (COZAAR) 50 mg Oral Tablet Take 1 Tablet (50 mg total) by mouth Once a day    magnesium oxide (MAG-OX) 400 mg Oral Tablet daily    Pen Needle, Disposable, 32 gauge x 5/32" Needle To use 4 times daily with insulin    sertraline (ZOLOFT) 100 mg Oral Tablet sertraline 100 mg tablet    torsemide (DEMADEX) 10 mg Oral Tablet Take 1 Tablet (10 mg total) by mouth Once a day    [DISCONTINUED] solifenacin (VESICARE) 5 mg Oral  Tablet Take 1 po once per day      Allergies   Allergen Reactions    Penicillins Anaphylaxis    Insulin Glargine Rash and Hives/ Urticaria    Lisinopril     Losartan     Statins-Hmg-Coa Reductase Inhibitors Diarrhea, Nausea/ Vomiting and Myalgia     Duricef(Cefadroxil), Trulicity  Solution Pen Injector.        Review of Systems:  Review of Systems   Constitutional:  Negative for malaise/fatigue.   Eyes:  Negative for blurred vision.   Respiratory:  Negative for shortness of breath.    Cardiovascular:  Negative for chest pain and leg swelling.   Gastrointestinal:  Negative for abdominal pain, diarrhea, nausea and vomiting.   Genitourinary:  Negative for frequency.   Endo/Heme/Allergies:  Negative for polydipsia.        BP (!) 140/92   Pulse 83   Temp 36 C (96.8 F)   Resp 17   Ht 1.549 m (5\' 1" )   Wt 104 kg (229 lb)   SpO2 92%   BMI 43.27 kg/m       Physical Exam  Vitals reviewed.   Constitutional:       Appearance: Normal appearance. She is obese.   HENT:      Head: Normocephalic.   Cardiovascular:      Rate and Rhythm: Normal rate and regular rhythm.   Pulmonary:      Effort: Pulmonary effort is normal.      Breath sounds: Normal breath sounds.   Musculoskeletal:      Cervical back: Neck supple.   Skin:     General: Skin is warm and dry.   Neurological:      General: No focal deficit present.      Mental Status: She is alert and oriented to person, place, and time.          Data Reviewed:    Outside Lab Results 09/23/21:  HgbA1C; 8.4  GFR: 100  GFR: 0.5  Albumin/creatinine: 56.3    Outside Lab Results 04/14/21:  LDL: 132  Triglycerides: 117  TSH: 2.48    Assessment/Plan:  Assessment/Plan   1. Type 2 diabetes mellitus with hyperglycemia, with long-term current use of insulin (CMS HCC)    2. Mixed hyperlipidemia    3. Essential hypertension         ICD-10-CM    1. Type 2 diabetes mellitus with hyperglycemia, with long-term current use of insulin (CMS HCC)  E11.65 BASIC METABOLIC PANEL, FASTING    Z79.4 HGA1C  (HEMOGLOBIN A1C WITH EST AVG GLUCOSE)     THYROID STIMULATING HORMONE (SENSITIVE TSH)     insulin degludec 200 unit/mL (3 mL) Subcutaneous Insulin Pen     insulin lispro (HUMALOG KWIKPEN INSULIN) 100 unit/mL Subcutaneous Insulin Pen      2. Mixed hyperlipidemia  E78.2       3. Essential hypertension  I10          Orders Placed This Encounter    BASIC METABOLIC PANEL, FASTING    HGA1C (HEMOGLOBIN A1C WITH EST AVG GLUCOSE)    THYROID STIMULATING HORMONE (SENSITIVE TSH)    insulin degludec 200 unit/mL (3 mL) Subcutaneous Insulin Pen    insulin lispro (HUMALOG KWIKPEN INSULIN) 100 unit/mL Subcutaneous Insulin Pen      Return in about 3 months (around 07/26/2022).    Type 2 diabetes - reviewed Dexcom download with this patient and daughter.  We will decrease Tresiba back to prescribed dose of 34 units once daily, continue Humalog 7 units with breakfast increase to 9 units with dinner as she usually only eats 2 meals per day and continue correction scale.  We did review her again how to take the Humalog with meals.  She is let me know if she continues to have postprandial hyperglycemia or hypoglycemia.  Hypertension -  not at goal today, close to goal, reports she is not taken her medication today continue current regimen and continue to monitor  Hyperlipidemia - LDL not at goal, intolerant to statins d/t myalgias  Neuropathy - reviewed importance of foot care and proper shoes, follows with podiatry regularly  Obesity - benefits of weight loss with emphasis on lowering insulin resistance reviewed, recommend following meal plan and increasing activity      On the day of the encounter, a total of  30 minutes was spent on this patient encounter including review of historical information, examination, documentation and post-visit activities.      This note may have been partially generated using MModal Fluency Direct system, and there may be some incorrect words, spellings, and punctuation that were not noted in checking the  note before saving.    [name], APRN

## 2022-06-15 LAB — POCT HGB A1C: POCT HGB A1C: 8.5 % — AB (ref 4–6)

## 2022-06-15 LAB — HM EGFR: GFR: 92 mL/min/{1.73_m2}

## 2022-07-27 ENCOUNTER — Other Ambulatory Visit: Payer: Self-pay

## 2022-07-27 ENCOUNTER — Encounter (RURAL_HEALTH_CENTER): Payer: Self-pay | Admitting: NURSE PRACTITIONER

## 2022-07-27 ENCOUNTER — Ambulatory Visit: Payer: Medicare Other | Attending: NURSE PRACTITIONER | Admitting: NURSE PRACTITIONER

## 2022-07-27 VITALS — BP 148/92 | HR 74 | Temp 96.9°F | Resp 17 | Ht 60.98 in | Wt 228.6 lb

## 2022-07-27 DIAGNOSIS — Z794 Long term (current) use of insulin: Secondary | ICD-10-CM | POA: Insufficient documentation

## 2022-07-27 DIAGNOSIS — I10 Essential (primary) hypertension: Secondary | ICD-10-CM | POA: Insufficient documentation

## 2022-07-27 DIAGNOSIS — Z6841 Body Mass Index (BMI) 40.0 and over, adult: Secondary | ICD-10-CM | POA: Insufficient documentation

## 2022-07-27 DIAGNOSIS — E1165 Type 2 diabetes mellitus with hyperglycemia: Secondary | ICD-10-CM | POA: Insufficient documentation

## 2022-07-27 DIAGNOSIS — R809 Proteinuria, unspecified: Secondary | ICD-10-CM | POA: Insufficient documentation

## 2022-07-27 DIAGNOSIS — E1140 Type 2 diabetes mellitus with diabetic neuropathy, unspecified: Secondary | ICD-10-CM | POA: Insufficient documentation

## 2022-07-27 DIAGNOSIS — E782 Mixed hyperlipidemia: Secondary | ICD-10-CM | POA: Insufficient documentation

## 2022-07-27 DIAGNOSIS — E669 Obesity, unspecified: Secondary | ICD-10-CM | POA: Insufficient documentation

## 2022-07-27 DIAGNOSIS — Z79899 Other long term (current) drug therapy: Secondary | ICD-10-CM | POA: Insufficient documentation

## 2022-07-27 MED ORDER — PEN NEEDLE, DIABETIC 32 GAUGE X 5/32"
2 refills | Status: DC
Start: 2022-07-27 — End: 2022-11-13

## 2022-07-27 MED ORDER — INSULIN DEGLUDEC (U-200) 200 UNIT/ML (3 ML) SUBCUTANEOUS PEN
PEN_INJECTOR | SUBCUTANEOUS | 1 refills | Status: DC
Start: 2022-07-27 — End: 2022-11-13

## 2022-07-27 MED ORDER — INSULIN LISPRO (U-100) 100 UNIT/ML SUBCUTANEOUS PEN
PEN_INJECTOR | SUBCUTANEOUS | 1 refills | Status: DC
Start: 2022-07-27 — End: 2022-08-24

## 2022-07-27 NOTE — Progress Notes (Signed)
[place]  [address] [place]  [place] [address]  [phone]   Progress Note    Date of Service:  07/27/2022  Jeanne, [name], 71 y.o. female  Date of Birth:  06/03/1951  PCP: Ronnette Hila, DO     HPI:  Jeanne [name] presents to Endocrinology clinic for a follow-up for Type 2 diabetes complicated by other comorbid conditions including: hypertension, hyperlipidemia and obesity. Diagnosed with diabetes in 1997, at age 53. Family history is Positive for Type 2 diabetes- father.  Most recent A1c is 9.1 - indicative of suboptimal glycemic control.  Review of Dexcom download for the last 2 weeks reveals the following:  Average glucose:  232  GMI:  8.9%  In range:  17%  High: 51%  Very high: 32%  Low: Less than 0%  Very low: 0%  Current diabetes medications include:  Tresiba 36 units once daily; Humalog 7 units with breakfast and 9 units with dinner plus correction 2:50> 150  Does not  miss medications doses.  Denies difficulty affording medication and/or test strips.   Previous diabetes medication include: Metformin (nausea, diarrhea- even with extended release even with lower doses); Trulicity (nausea); Lantus (rash): Glipizide; Humalog 75/25  Has glucagon at home: Yes  Diabetes Education in the past: No RD in the past - No  Lives with: family  Weight has: remained stable  Any hospitalizations r/t to diabetes over the last year: No  Chronic Complications:  Microvascular - no known history of retinopathy, nephropathy, positive for neuropathy   Macrovascular- positive hx of MI, negative for CVA and TIA  Autonomic Dysfunction - denies gastroparesis, or bowel/bladder dysfunction  Denies foot ulcers, deformities. Does perform foot care at home - follows with podiatry in [place] every 3 months  Experiences hypoglycemia: Yes. Symptoms of hypoglycemia present during an event: lethargy, sweats and shakes Experiences  hypoglycemia: rarely  Up to date with opthamology exam: Yes, 09/2021, Dr. Gay Filler, [place], [place]  Steroids recently: No  History of frequent UTI: No  History of frequent genital yeast infection: No  History of pancreatitis: No  Personal or family Multiple Endocrine Neoplasia Type 2 (MEN2): No     Review of CGM download does show that she has had a significant increase in glucose.  Denies hypoglycemia.  Reports that she did have labs drawn with PCP end of September we will call to obtain results.    I have personally reviewed allergies, chief complaint, medications, past medical history, surgical history, and family history.  I have reviewed referral notes, PCP progress notes and labs relevant to this visit.    Past Medical History:   Diagnosis Date    Clouded crystalline lens     Contact dermatitis and eczema     Depressive disorder     Diverticulitis     Generalized anxiety disorder     High cholesterol     HTN (hypertension)     Hyperglycemia     Hypomagnesemia     IBS (irritable bowel syndrome)     Mixed hyperlipidemia     Premature atrial contraction     T2DM (type 2 diabetes mellitus) (CMS HCC)     Unspecified cataract     Vision loss     Vitamin D deficiency       Past Surgical History:   Procedure Laterality Date    CESAREAN SECTION      HX CATARACT REMOVAL  HX HYSTERECTOMY  1983    HX LAP CHOLECYSTECTOMY      HX PARTIAL HYSTERECTOMY      INCONTINENCE SURGERY  12/09/2021    KNEE SURGERY      Knee replacements    SHOULDER SURGERY        Social History     Tobacco Use    Smoking status: Never     Passive exposure: Never    Smokeless tobacco: Never   Vaping Use    Vaping Use: Never used   Substance Use Topics    Alcohol use: Never    Drug use: Never       Family Medical History:       Problem Relation (Age of Onset)    Anesthesia Complications Mother    Asthma Brother    Cancer Mother    Cervical Cancer Mother    Diabetes Father    Hypertension (High Blood Pressure) Father    Lung disease Father            Outpatient Medications Marked as Taking for the 07/27/22 encounter (Office Visit) with Dimas Chyle, APRN   Medication Sig    celecoxib (CELEBREX) 200 mg Oral Capsule celecoxib 200 mg capsule   TAKE 1 CAPLET BY MOUTH EVERY MORNING - TAKE AFTER FOOD    ergocalciferol, vitamin D2, (DRISDOL) 1,250 mcg (50,000 unit) Oral Capsule Vitamin D2 1,250 mcg (50,000 unit) capsule   Take 1 capsule every week by oral route for 84 days.    insulin degludec 200 unit/mL (3 mL) Subcutaneous Insulin Pen Inject 38 units once daily, adjust as directed, MDD 60    insulin lispro (HUMALOG KWIKPEN INSULIN) 100 unit/mL Subcutaneous Insulin Pen Inject 13 units with breakfast and 16 units dinner plus correction 2 units for every 50 greater than 150 with meals, MDD 50    losartan (COZAAR) 50 mg Oral Tablet Take 1 Tablet (50 mg total) by mouth Once a day    magnesium oxide (MAG-OX) 400 mg Oral Tablet daily    Pen Needle, Disposable, 32 gauge x 5/32" Needle To use 4 times daily with insulin    sertraline (ZOLOFT) 100 mg Oral Tablet sertraline 100 mg tablet    torsemide (DEMADEX) 10 mg Oral Tablet Take 1 Tablet (10 mg total) by mouth Once a day      Allergies   Allergen Reactions    Penicillins Anaphylaxis    Insulin Glargine Rash and Hives/ Urticaria    Lisinopril     Losartan     Statins-Hmg-Coa Reductase Inhibitors Diarrhea, Nausea/ Vomiting and Myalgia     Duricef(Cefadroxil), Trulicity Solution Pen Injector.        Review of Systems:  Review of Systems   Constitutional:  Negative for malaise/fatigue.   Eyes:  Negative for blurred vision.   Respiratory:  Negative for shortness of breath.    Cardiovascular:  Negative for chest pain and leg swelling.   Gastrointestinal:  Negative for abdominal pain, diarrhea, nausea and vomiting.   Genitourinary:  Negative for frequency.   Endo/Heme/Allergies:  Negative for polydipsia.          BP (!) 148/92   Pulse 74   Temp 36.1 C (96.9 F) (Temporal)   Resp 17   Ht 1.549 m (5' 0.98")   Wt 104 kg (228  lb 9.6 oz)   SpO2 99%   BMI 43.22 kg/m       Physical Exam  Vitals reviewed.   Constitutional:  Appearance: Normal appearance. She is obese.   HENT:      Head: Normocephalic.   Cardiovascular:      Rate and Rhythm: Normal rate and regular rhythm.   Pulmonary:      Effort: Pulmonary effort is normal.      Breath sounds: Normal breath sounds.   Musculoskeletal:      Cervical back: Neck supple.   Skin:     General: Skin is warm and dry.   Neurological:      General: No focal deficit present.      Mental Status: She is alert and oriented to person, place, and time.            Data Reviewed:    A1C: 9.1  A1C Date: 04/25/2022  COMPREHENSIVE METABOLIC PANEL   Lab Results   Component Value Date    SODIUM 140 04/25/2022    POTASSIUM 4.4 04/25/2022    CHLORIDE 102 04/25/2022    CO2 30 04/25/2022    ANIONGAP 8 04/25/2022    BUN 13 04/25/2022    CREATININE 0.74 04/25/2022    GLUCOSENF 164 (H) 04/25/2022    CALCIUM 9.8 04/25/2022    ALBUMIN 3.6 04/25/2022    TOTALPROTEIN 7.3 04/25/2022    ALKPHOS 80 04/25/2022    AST 12 04/25/2022    ALT 12 04/25/2022         LIPID PROFILE  Lab Results   Component Value Date    CHOLESTEROL 222 (H) 04/25/2022    HDLCHOL 53 04/25/2022    LDLCHOL 148 (H) 04/25/2022    TRIG 119 04/25/2022       THYROID STIMULATING HORMONE  Lab Results   Component Value Date    TSH 1.254 04/25/2022        Urine Microalb/cr ratio   Lab Results   Component Value Date/Time    MICALBCRERAT 36.9 (H) 04/25/2022 12:09 PM                 Outside Lab Results 09/23/21:  HgbA1C; 8.4  GFR: 100  GFR: 0.5  Albumin/creatinine: 56.3        Assessment/Plan:  Assessment/Plan   1. Type 2 diabetes mellitus with hyperglycemia, with long-term current use of insulin (CMS HCC)    2. Mixed hyperlipidemia    3. Essential hypertension         ICD-10-CM    1. Type 2 diabetes mellitus with hyperglycemia, with long-term current use of insulin (CMS HCC)  E11.65 insulin lispro (HUMALOG KWIKPEN INSULIN) 100 unit/mL Subcutaneous Insulin Pen     Z79.4 insulin degludec 200 unit/mL (3 mL) Subcutaneous Insulin Pen     Pen Needle, Disposable, 32 gauge x 5/32" Needle     COMPREHENSIVE METABOLIC PANEL, NON-FASTING     HGA1C (HEMOGLOBIN A1C WITH EST AVG GLUCOSE)     LIPID PANEL     MICROALBUMIN/CREATININE RATIO, URINE, RANDOM      2. Mixed hyperlipidemia  E78.2       3. Essential hypertension  I10          Orders Placed This Encounter    COMPREHENSIVE METABOLIC PANEL, NON-FASTING    HGA1C (HEMOGLOBIN A1C WITH EST AVG GLUCOSE)    LIPID PANEL    MICROALBUMIN/CREATININE RATIO, URINE, RANDOM    insulin lispro (HUMALOG KWIKPEN INSULIN) 100 unit/mL Subcutaneous Insulin Pen    insulin degludec 200 unit/mL (3 mL) Subcutaneous Insulin Pen    Pen Needle, Disposable, 32 gauge x 5/32" Needle      Return in  about 3 months (around 10/27/2022).    Type 2 diabetes - reviewed Dexcom download with this patient and daughter.  Increase Tresiba 38 units once daily, increase Humalog to 13 units with brunch and 9 units with supper and continue correction scale.  Advised patient if they are in the area in 1-2 weeks to stop by the office for [facility] receiver download so we can continue to adjust insulin doses.  Otherwise, we will do a phone visit towards the end of the month.  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  Continue Losartan      On the day of the encounter, a total of  30 minutes was spent on this patient encounter including review of historical  information, examination, documentation and post-visit activities.      This note may have been partially generated using MModal Fluency Direct system, and there may be some incorrect words, spellings, and punctuation that were not noted in checking the note before saving.    Dimas Chyle, APRN

## 2022-07-27 NOTE — Patient Instructions (Addendum)
-  Increase Tresiba to 38 units once daily  -Increase Humalog to 13 units with brunch and 16 units with dinner and continue correction scale  -Continue Dexcom use  -Follow up phone visit with Lexine Baton 11/30  -Follow up in 3 months, with labs before  -Call for any questions or concerns  -Call for 2 or more blood sugars less than 80 in one week, or any noted pattern of high or low blood sugars      Hypoglycemia (Low Blood Sugar/ Under 80)  How to avoid hypoglycemia:  1. Eat meals and snacks on time  2. Check blood sugar regularly  3. Be mindful of activity and ALWAYS check blood sugar before activity/exercise and before driving  4. ALWAYS have a fast acting source of glucose with you at all times (glucose tablets, glucose gel, smarties, hard candy, or juice box)  TREATMENT  1. Test Blood Sugar at the fingertip - if unable to test blood sugar - treat like it is low  2. Treatment - Rule of 15 eat or drink 15 grams of fast acting carbs (example: 1/2 cup of juice or regular soda, 1 cup low fat or skim milk, 4 glucose tablets, 6 lifesavers)  *If unable to eat use glucagon injection AND call 911  3. Recheck blood sugar in 15 minutes  If blood sugar is less than 80- repeat rule of 15 (as above)  If blood sugar is greater than 80 - be mindful, continue checking blood sugar

## 2022-07-31 ENCOUNTER — Other Ambulatory Visit (RURAL_HEALTH_CENTER): Payer: Self-pay

## 2022-08-01 ENCOUNTER — Telehealth (RURAL_HEALTH_CENTER): Payer: Self-pay | Admitting: NURSE PRACTITIONER

## 2022-08-01 NOTE — Telephone Encounter (Signed)
Please notify pt/daughter that we did get lab results form PCP office.  A1C has improved some to 8.5, so this is certainly a step in the right direction.

## 2022-08-03 NOTE — Telephone Encounter (Signed)
Patient notified

## 2022-08-24 ENCOUNTER — Ambulatory Visit: Payer: Medicare Other | Attending: NURSE PRACTITIONER | Admitting: NURSE PRACTITIONER

## 2022-08-24 ENCOUNTER — Other Ambulatory Visit: Payer: Self-pay

## 2022-08-24 DIAGNOSIS — E1165 Type 2 diabetes mellitus with hyperglycemia: Secondary | ICD-10-CM | POA: Insufficient documentation

## 2022-08-24 DIAGNOSIS — Z794 Long term (current) use of insulin: Secondary | ICD-10-CM | POA: Insufficient documentation

## 2022-08-24 MED ORDER — INSULIN LISPRO (U-100) 100 UNIT/ML SUBCUTANEOUS PEN
PEN_INJECTOR | SUBCUTANEOUS | Status: DC
Start: 2022-08-24 — End: 2022-11-13

## 2022-08-24 NOTE — Progress Notes (Signed)
[place] [facility]  FAMILY MEDICINE, [facility]  [address]   [place] [address]  [phone]   Telephone Visit    Name:  Jeanne [name] MRN: [id number]   Date:  08/24/2022 Age:   71 y.o.     The patient/family initiated a request for telephone service.  Verbal consent for this service was obtained from the patient/family.    Last office visit in this department: 07/27/2022      Reason for call: Diabetes Follow Up    Call notes:     Current diabetes medications include:  Tresiba 38 units once daily; Humalog 13 units with brunch and 16 units with dinner plus correction 2:50> 150     Pt reports the following blood sugars:  Fasting: 140-200  Before Dinner: 140-200  Bedtime: >200    Feels blood sugars have improved.  Did have one episode of nocturnal hypoglycemia.      ICD-10-CM    1. Type 2 diabetes mellitus with hyperglycemia, with long-term current use of insulin (CMS HCC)  E11.65 insulin lispro (HUMALOG KWIKPEN INSULIN) 100 unit/mL Subcutaneous Insulin Pen    Z79.4         Plan: Continue Tresiba 38 units once daily;  Increase Humalog with brunch to 38 units and supper to 12 units.  Advised if 2 or more episodes of hypoglycemia less than 80 in one week to please let me know.  Otherwise, follow up in February as scheduled.    Total provider time spent with the patient on the phone: 5 minutes.    [name], APRN

## 2022-08-30 ENCOUNTER — Ambulatory Visit (INDEPENDENT_AMBULATORY_CARE_PROVIDER_SITE_OTHER): Payer: Self-pay | Admitting: NURSE PRACTITIONER

## 2022-10-31 ENCOUNTER — Telehealth (RURAL_HEALTH_CENTER): Payer: Self-pay | Admitting: NURSE PRACTITIONER

## 2022-10-31 NOTE — Telephone Encounter (Signed)
Provided new Dexcom G7. States that they have called for replacements with Dexcom and not received yet. [id number]  [id number]

## 2022-11-06 ENCOUNTER — Other Ambulatory Visit: Payer: Medicare Other | Attending: NURSE PRACTITIONER

## 2022-11-06 ENCOUNTER — Other Ambulatory Visit: Payer: Self-pay

## 2022-11-06 DIAGNOSIS — Z794 Long term (current) use of insulin: Secondary | ICD-10-CM | POA: Insufficient documentation

## 2022-11-06 DIAGNOSIS — E1165 Type 2 diabetes mellitus with hyperglycemia: Secondary | ICD-10-CM | POA: Insufficient documentation

## 2022-11-06 LAB — COMPREHENSIVE METABOLIC PANEL, NON-FASTING
ALBUMIN: 3.6 g/dL (ref 3.4–4.8)
ALKALINE PHOSPHATASE: 82 U/L (ref 55–145)
ALT (SGPT): 6 U/L — ABNORMAL LOW (ref 8–22)
ANION GAP: 9 mmol/L (ref 4–13)
AST (SGOT): 11 U/L (ref 8–45)
BILIRUBIN TOTAL: 0.5 mg/dL (ref 0.3–1.3)
BUN/CREA RATIO: 14 (ref 6–22)
BUN: 11 mg/dL (ref 8–25)
CALCIUM: 9.5 mg/dL (ref 8.6–10.3)
CHLORIDE: 106 mmol/L (ref 96–111)
CO2 TOTAL: 27 mmol/L (ref 23–31)
CREATININE: 0.76 mg/dL (ref 0.60–1.05)
ESTIMATED GFR - FEMALE: 83 mL/min/BSA (ref >=60)
GLUCOSE: 152 mg/dL — ABNORMAL HIGH (ref 65–125)
POTASSIUM: 3.8 mmol/L (ref 3.5–5.1)
PROTEIN TOTAL: 7.2 g/dL (ref 6.0–8.0)
SODIUM: 142 mmol/L (ref 136–145)

## 2022-11-06 LAB — LIPID PANEL
CHOL/HDL RATIO: 3.9
CHOLESTEROL: 189 mg/dL (ref 100–200)
HDL CHOL: 48 mg/dL — ABNORMAL LOW (ref >=50)
LDL CALC: 124 mg/dL — ABNORMAL HIGH (ref <100)
NON-HDL: 141 mg/dL (ref <=190)
TRIGLYCERIDES: 95 mg/dL (ref <150)
VLDL CALC: 16 mg/dL (ref <30)

## 2022-11-06 LAB — MICROALBUMIN/CREATININE RATIO, URINE, RANDOM
CREATININE RANDOM URINE: 113 mg/dL — ABNORMAL HIGH (ref 50–100)
MICROALBUMIN RANDOM URINE: 4.7 mg/dL
MICROALBUMIN/CREATININE RATIO RANDOM URINE: 41.6 mg/g — ABNORMAL HIGH (ref <30.0)

## 2022-11-06 LAB — HGA1C (HEMOGLOBIN A1C WITH EST AVG GLUCOSE)
ESTIMATED AVERAGE GLUCOSE: 171 mg/dL
HEMOGLOBIN A1C: 7.6 % — ABNORMAL HIGH (ref 4.0–6.0)

## 2022-11-13 ENCOUNTER — Encounter (RURAL_HEALTH_CENTER): Payer: Self-pay | Admitting: NURSE PRACTITIONER

## 2022-11-13 ENCOUNTER — Other Ambulatory Visit: Payer: Self-pay

## 2022-11-13 ENCOUNTER — Ambulatory Visit: Payer: Medicare Other | Attending: NURSE PRACTITIONER | Admitting: NURSE PRACTITIONER

## 2022-11-13 VITALS — BP 148/86 | HR 92 | Temp 96.9°F | Resp 17 | Ht 60.98 in | Wt 221.6 lb

## 2022-11-13 DIAGNOSIS — H43819 Vitreous degeneration, unspecified eye: Secondary | ICD-10-CM | POA: Insufficient documentation

## 2022-11-13 DIAGNOSIS — E1165 Type 2 diabetes mellitus with hyperglycemia: Secondary | ICD-10-CM | POA: Insufficient documentation

## 2022-11-13 DIAGNOSIS — Z6841 Body Mass Index (BMI) 40.0 and over, adult: Secondary | ICD-10-CM | POA: Insufficient documentation

## 2022-11-13 DIAGNOSIS — I10 Essential (primary) hypertension: Secondary | ICD-10-CM | POA: Insufficient documentation

## 2022-11-13 DIAGNOSIS — H26493 Other secondary cataract, bilateral: Secondary | ICD-10-CM | POA: Insufficient documentation

## 2022-11-13 DIAGNOSIS — Z794 Long term (current) use of insulin: Secondary | ICD-10-CM | POA: Insufficient documentation

## 2022-11-13 DIAGNOSIS — R809 Proteinuria, unspecified: Secondary | ICD-10-CM | POA: Insufficient documentation

## 2022-11-13 DIAGNOSIS — E782 Mixed hyperlipidemia: Secondary | ICD-10-CM | POA: Insufficient documentation

## 2022-11-13 DIAGNOSIS — Z7985 Long-term (current) use of injectable non-insulin antidiabetic drugs: Secondary | ICD-10-CM | POA: Insufficient documentation

## 2022-11-13 DIAGNOSIS — E669 Obesity, unspecified: Secondary | ICD-10-CM | POA: Insufficient documentation

## 2022-11-13 DIAGNOSIS — E1142 Type 2 diabetes mellitus with diabetic polyneuropathy: Secondary | ICD-10-CM | POA: Insufficient documentation

## 2022-11-13 MED ORDER — INSULIN DEGLUDEC (U-200) 200 UNIT/ML (3 ML) SUBCUTANEOUS PEN
PEN_INJECTOR | SUBCUTANEOUS | 2 refills | Status: DC
Start: 2022-11-13 — End: 2022-11-13

## 2022-11-13 MED ORDER — OZEMPIC 0.25 MG OR 0.5 MG (2 MG/3 ML) SUBCUTANEOUS PEN INJECTOR
PEN_INJECTOR | SUBCUTANEOUS | 1 refills | Status: DC
Start: 2022-11-13 — End: 2022-11-13

## 2022-11-13 MED ORDER — PEN NEEDLE, DIABETIC 32 GAUGE X 5/32"
3 refills | Status: DC
Start: 2022-11-13 — End: 2022-11-13

## 2022-11-13 MED ORDER — INSULIN DEGLUDEC (U-200) 200 UNIT/ML (3 ML) SUBCUTANEOUS PEN
PEN_INJECTOR | SUBCUTANEOUS | 2 refills | Status: DC
Start: 2022-11-13 — End: 2023-02-14

## 2022-11-13 MED ORDER — INSULIN LISPRO (U-100) 100 UNIT/ML SUBCUTANEOUS PEN
PEN_INJECTOR | SUBCUTANEOUS | 2 refills | Status: DC
Start: 2022-11-13 — End: 2023-02-14

## 2022-11-13 MED ORDER — INSULIN LISPRO (U-100) 100 UNIT/ML SUBCUTANEOUS PEN
PEN_INJECTOR | SUBCUTANEOUS | 2 refills | Status: DC
Start: 2022-11-13 — End: 2022-11-13

## 2022-11-13 MED ORDER — PEN NEEDLE, DIABETIC 32 GAUGE X 5/32"
3 refills | Status: DC
Start: 2022-11-13 — End: 2024-06-24

## 2022-11-13 MED ORDER — OZEMPIC 0.25 MG OR 0.5 MG (2 MG/3 ML) SUBCUTANEOUS PEN INJECTOR
PEN_INJECTOR | SUBCUTANEOUS | 1 refills | Status: DC
Start: 2022-11-13 — End: 2023-02-14

## 2022-11-13 NOTE — Patient Instructions (Addendum)
-  Continue Tresiba 38 units once daily  -Decrease Humalog to 14 units with brunch and continue 20 units with dinner plus correction scale  -Start Ozempic 0.63m once weekly for 4 weeks then increase to 0.542monce weekly  -Continue Dexcom use  -Follow up with Jeanne [name] 3 months, with labs before  -Call for any questions or concerns  -Call for 2 or more blood sugars less than 80 in one week, or any noted pattern of high or low blood sugars      Hypoglycemia (Low Blood Sugar/ Under 80)  How to avoid hypoglycemia:  1. Eat meals and snacks on time  2. Check blood sugar regularly  3. Be mindful of activity and ALWAYS check blood sugar before activity/exercise and before driving  4. ALWAYS have a fast acting source of glucose with you at all times (glucose tablets, glucose gel, smarties, hard candy, or juice box)  TREATMENT  1. Test Blood Sugar at the fingertip - if unable to test blood sugar - treat like it is low  2. Treatment - Rule of 15 eat or drink 15 grams of fast acting carbs (example: 1/2 cup of juice or regular soda, 1 cup low fat or skim milk, 4 glucose tablets, 6 lifesavers)  *If unable to eat use glucagon injection AND call 911  3. Recheck blood sugar in 15 minutes  If blood sugar is less than 80- repeat rule of 15 (as above)  If blood sugar is greater than 80 - be mindful, continue checking blood sugar      GLP-1 Medications (Byetta, Bydureon, Ozempic, Rybelsus, Soliqua, Tanzeum, Trulicity, Victoza)  Potential side effect   -Nausea is expected as your body adjusts to this medication. If nausea is severe and not tolerable please call the office.   Titration:   -You will start on a low dose and as long as you are tolerating the medication okay, you will titrate (go up) as directed.  Titration times varying depending upon which medication.  Potential adverse reactions:   -Pancreatitis (inflammation of the pancreas) - signs and symptoms include severe nausea, abdominal pain which may or may not radiate (extend) to  the back, and/or vomiting. If you develop these symptoms STOP this medication and call the office IMMEDIATELY. If you are vomiting with abdominal pain you may need to go the emergency room.   -Medullary thyroid cancer particularly shown in rats/mice.   If at any time you have questions please call the office.

## 2022-11-13 NOTE — Progress Notes (Unsigned)
[facility]  [address] [place]  [place] [address]  [phone]   Progress Note    Date of Service:  11/14/2022  Jeanne [name], Jeanne [name], 72 y.o. female  Date of Birth:  06-03-1951  PCP: Jeanne Hila, DO     HPI:  Jeanne [name] presents to Endocrinology clinic for a follow-up for Type 2 diabetes complicated by other comorbid conditions including: hypertension, hyperlipidemia and obesity. Diagnosed with diabetes in 1997, at age 89. Family history is Positive for Type 2 diabetes- father.  Most recent A1c is 7.6 - indicative of suboptimal glycemic control.  Review of Dexcom download for 10/29/22-11/11/22 reveals the following:  Average glucose:  190  GMI:  8.9%  In range:  48%  High: 39%  Very high: 13%  Low: Less than 0%  Very low: 0%  Current diabetes medications include:  Tresiba 38 units once daily; Humalog 16 units with brunch and 20 units with dinner plus correction 2:50> 150  Does not  miss medications doses.  Denies difficulty affording medication and/or test strips.   Previous diabetes medication include: Metformin (nausea, diarrhea- even with extended release even with lower doses); Trulicity (nausea); Lantus (rash): Glipizide; Humalog 75/25  Has glucagon at home: Yes  Diabetes Education in the past: No RD in the past - No  Lives with: family  Weight has: decreased  Any hospitalizations r/t to diabetes over the last year: No  Chronic Complications:  Microvascular - no known history of retinopathy, nephropathy, positive for neuropathy   Macrovascular- positive hx of MI, negative for CVA and TIA  Autonomic Dysfunction - denies gastroparesis, or bowel/bladder dysfunction  Denies foot ulcers, deformities. Does perform foot care at home - follows with podiatry in [place] every 3 months  Experiences hypoglycemia: Yes. Symptoms of hypoglycemia present during an event: lethargy, sweats and shakes Experiences hypoglycemia:  rarely  Up to date with opthamology exam: Yes, 09/2021, Dr. Gay Filler, [place], [place]  Steroids recently: No  History of frequent UTI: No  History of frequent genital yeast infection: No  History of pancreatitis: No  Personal or family Multiple Endocrine Neoplasia Type 2 (MEN2): No     Glycemic control is improving and Jeanne [name] has lost about 8 lbs since last visit.  She is having some problems with her Dexcom staying on for the full 10 days.  CGM does show some postprandial hyperglycemia, mostly with dinner.    I have personally reviewed allergies, chief complaint, medications, past medical history, surgical history, and family history.  I have reviewed referral notes, PCP progress notes and labs relevant to this visit.    Past Medical History:   Diagnosis Date    Clouded crystalline lens     Contact dermatitis and eczema     Depressive disorder     Diverticulitis     Generalized anxiety disorder     High cholesterol     HTN (hypertension)     Hyperglycemia     Hypomagnesemia     IBS (irritable bowel syndrome)     Mixed hyperlipidemia     Premature atrial contraction     T2DM (type 2 diabetes mellitus) (CMS HCC)     Unspecified cataract     Vision loss     Vitamin D deficiency       Past Surgical History:   Procedure Laterality Date    CESAREAN SECTION      HX CATARACT REMOVAL  HX HYSTERECTOMY  1983    HX LAP CHOLECYSTECTOMY      HX PARTIAL HYSTERECTOMY      INCONTINENCE SURGERY  12/09/2021    KNEE SURGERY      Knee replacements    SHOULDER SURGERY        Social History     Tobacco Use    Smoking status: Never     Passive exposure: Never    Smokeless tobacco: Never   Vaping Use    Vaping status: Never Used   Substance Use Topics    Alcohol use: Never    Drug use: Never       Family Medical History:       Problem Relation (Age of Onset)    Anesthesia Complications Mother    Asthma Brother    Cancer Mother    Cervical Cancer Mother    Diabetes Father    Hypertension (High Blood Pressure) Father    Lung disease Father            Outpatient Medications Marked as Taking for the 11/13/22 encounter (Office Visit) with Dimas Chyle, APRN   Medication Sig    celecoxib (CELEBREX) 200 mg Oral Capsule Take 1 Capsule (200 mg total) by mouth Once a day    ergocalciferol, vitamin D2, (DRISDOL) 1,250 mcg (50,000 unit) Oral Capsule Take 1 Capsule (50,000 Units total) by mouth Every 7 days    insulin degludec 200 unit/mL (3 mL) Subcutaneous Insulin Pen Inject 38 units once daily, adjust as directed, MDD 60    insulin lispro (HUMALOG KWIKPEN INSULIN) 100 unit/mL Subcutaneous Insulin Pen Inject 14 units with bunch and 20 units dinner plus correction 2 units for every 50 greater than 150 with meals, MDD 50    ketoconazole (NIZORAL) 2 % Cream Apply topically Twice daily    losartan (COZAAR) 50 mg Oral Tablet Take 2 Tablets (100 mg total) by mouth Once a day    magnesium oxide (MAG-OX) 400 mg Oral Tablet Take 1 Tablet (400 mg total) by mouth Once a day    Pen Needle, Disposable, 32 gauge x 5/32" Needle To use 4 times daily with insulin    semaglutide (OZEMPIC) 0.25 mg or 0.5 mg (2 mg/3 mL) Subcutaneous Pen Injector Inject 0.60m once weekly for 4 weeks then increase to 0.548monce weekly    sertraline (ZOLOFT) 100 mg Oral Tablet Take 1 Tablet (100 mg total) by mouth Once a day    torsemide (DEMADEX) 10 mg Oral Tablet Take 1 Tablet (10 mg total) by mouth Once a day      Allergies   Allergen Reactions    Penicillins Anaphylaxis    Insulin Glargine Rash and Hives/ Urticaria    Lisinopril     Losartan     Statins-Hmg-Coa Reductase Inhibitors Diarrhea, Nausea/ Vomiting and Myalgia     Duricef(Cefadroxil), Trulicity Solution Pen Injector.        Review of Systems:  Review of Systems   Constitutional:  Negative for malaise/fatigue.   Eyes:  Negative for blurred vision.   Respiratory:  Negative for shortness of breath.    Cardiovascular:  Negative for chest pain and leg swelling.   Gastrointestinal:  Negative for abdominal pain, diarrhea, nausea and vomiting.    Genitourinary:  Negative for frequency.   Endo/Heme/Allergies:  Negative for polydipsia.          BP (!) 148/86   Pulse 92   Temp 36.1 C (96.9 F) (Temporal)   Resp 17  Ht 1.549 m (5' 0.98")   Wt 101 kg (221 lb 9.6 oz)   SpO2 96%   BMI 41.89 kg/m       Physical Exam  Vitals reviewed.   Constitutional:       Appearance: Normal appearance. She is obese.   HENT:      Head: Normocephalic.   Cardiovascular:      Rate and Rhythm: Normal rate and regular rhythm.   Pulmonary:      Effort: Pulmonary effort is normal.      Breath sounds: Normal breath sounds.   Musculoskeletal:      Cervical back: Neck supple.   Skin:     General: Skin is warm and dry.   Neurological:      General: No focal deficit present.      Mental Status: She is alert and oriented to person, place, and time.            Data Reviewed:    A1C: 7.6  A1C Date: 11/06/2022  COMPREHENSIVE METABOLIC PANEL   Lab Results   Component Value Date    SODIUM 142 11/06/2022    POTASSIUM 3.8 11/06/2022    CHLORIDE 106 11/06/2022    CO2 27 11/06/2022    ANIONGAP 9 11/06/2022    BUN 11 11/06/2022    CREATININE 0.76 11/06/2022    CALCIUM 9.5 11/06/2022    ALBUMIN 3.6 11/06/2022    TOTALPROTEIN 7.2 11/06/2022    ALKPHOS 82 11/06/2022    AST 11 11/06/2022    ALT 6 (L) 11/06/2022         LIPID PROFILE  Lab Results   Component Value Date    CHOLESTEROL 189 11/06/2022    HDLCHOL 48 (L) 11/06/2022    LDLCHOL 124 (H) 11/06/2022    TRIG 95 11/06/2022       THYROID STIMULATING HORMONE  Lab Results   Component Value Date    TSH 1.254 04/25/2022        Urine Microalb/cr ratio   Lab Results   Component Value Date/Time    MICALBCRERAT 41.6 (H) 11/06/2022 10:47 AM                 Outside Lab Results 09/23/21:  HgbA1C; 8.4  GFR: 100  GFR: 0.5  Albumin/creatinine: 56.3        Assessment/Plan:  Assessment/Plan   1. Type 2 diabetes mellitus with hyperglycemia, with long-term current use of insulin (CMS HCC)    2. Mixed hyperlipidemia    3. Essential hypertension         ICD-10-CM     1. Type 2 diabetes mellitus with hyperglycemia, with long-term current use of insulin (CMS HCC)  123456 BASIC METABOLIC PANEL, FASTING    Z79.4 HGA1C (HEMOGLOBIN A1C WITH EST AVG GLUCOSE)     insulin degludec 200 unit/mL (3 mL) Subcutaneous Insulin Pen     insulin lispro (HUMALOG KWIKPEN INSULIN) 100 unit/mL Subcutaneous Insulin Pen     Pen Needle, Disposable, 32 gauge x 5/32" Needle     semaglutide (OZEMPIC) 0.25 mg or 0.5 mg (2 mg/3 mL) Subcutaneous Pen Injector     DISCONTINUED: Pen Needle, Disposable, 32 gauge x 5/32" Needle     DISCONTINUED: insulin lispro (HUMALOG KWIKPEN INSULIN) 100 unit/mL Subcutaneous Insulin Pen     DISCONTINUED: insulin degludec 200 unit/mL (3 mL) Subcutaneous Insulin Pen     DISCONTINUED: semaglutide (OZEMPIC) 0.25 mg or 0.5 mg (2 mg/3 mL) Subcutaneous Pen Injector     DISCONTINUED: insulin  degludec 200 unit/mL (3 mL) Subcutaneous Insulin Pen     DISCONTINUED: insulin lispro (HUMALOG KWIKPEN INSULIN) 100 unit/mL Subcutaneous Insulin Pen     DISCONTINUED: Pen Needle, Disposable, 32 gauge x 5/32" Needle     DISCONTINUED: semaglutide (OZEMPIC) 0.25 mg or 0.5 mg (2 mg/3 mL) Subcutaneous Pen Injector      2. Mixed hyperlipidemia  E78.2       3. Essential hypertension  I10          Orders Placed This Encounter    BASIC METABOLIC PANEL, FASTING    HGA1C (HEMOGLOBIN A1C WITH EST AVG GLUCOSE)    insulin degludec 200 unit/mL (3 mL) Subcutaneous Insulin Pen    insulin lispro (HUMALOG KWIKPEN INSULIN) 100 unit/mL Subcutaneous Insulin Pen    Pen Needle, Disposable, 32 gauge x 5/32" Needle    semaglutide (OZEMPIC) 0.25 mg or 0.5 mg (2 mg/3 mL) Subcutaneous Pen Injector      Return in about 3 months (around 02/11/2023).    Type 2 diabetes - reviewed Dexcom download with this patient and daughter. Continue Tresiba 38 units once daily.  Decrease Humalog at brunch to 14 units, continue 20 units at supper plus correction.  Start Ozempic 0.68m once weekly for 4 weeks, then increase to 0.569monce weekly.   Provided with G7 sample today.  Discussed that if she is having hypoglycemia with addition of Ozempic to please let me know so insulin can be adequately adjusted. Discussed GLP-1 medication action, side effect of nausea and risks of pancreatitis including symptoms including severe nausea, abdominal pain which may radiate to back, vomiting, ill feeling. Explained if these symptoms start patient should stop medication immediately and notify office. Discussed black box warning of medullary thyroid cancer. Patient verbalized understanding of above.  Hypertension - not at goal today, medication recently adjusted by cardiology.  To continue management per cardiology  Hyperlipidemia - LDL not at goal, however improved, intolerant to statins d/t myalgias  Neuropathy - reviewed importance of foot care and proper shoes, follows with podiatry regularly  Obesity - benefits of weight loss with emphasis on lowering insulin resistance reviewed, recommend following meal plan and increasing activity    Microabuminuria- Has improved, needs tighter glucose control.  Continue Losartan      On the day of the encounter, a total of  50 minutes was spent on this patient encounter including review of historical information, examination, documentation and post-visit activities.      This note may have been partially generated using M[employer] Fluency Direct system, and there may be some incorrect words, spellings, and punctuation that were not noted in checking the note before saving.    NiDimas ChyleAPRN

## 2022-11-14 ENCOUNTER — Other Ambulatory Visit (RURAL_HEALTH_CENTER): Payer: Self-pay

## 2022-11-14 ENCOUNTER — Telehealth (RURAL_HEALTH_CENTER): Payer: Self-pay | Admitting: NURSE PRACTITIONER

## 2022-11-14 NOTE — Telephone Encounter (Signed)
Patient's daughter called. They are having difficulty keeping the patches on and the sample provided yesterday was bad. She called Dexcom and they are going to replace the one that did not work yesterday. She is going to pick-up a sample today. Will set aside more samples in the future. Also told her what she can do for over-patches to keep the sensors on longer.

## 2023-02-14 ENCOUNTER — Other Ambulatory Visit: Payer: Self-pay

## 2023-02-14 ENCOUNTER — Other Ambulatory Visit: Payer: Medicare Other | Attending: NURSE PRACTITIONER

## 2023-02-14 ENCOUNTER — Encounter (RURAL_HEALTH_CENTER): Payer: Self-pay | Admitting: NURSE PRACTITIONER

## 2023-02-14 ENCOUNTER — Ambulatory Visit: Payer: Medicare Other | Attending: NURSE PRACTITIONER | Admitting: NURSE PRACTITIONER

## 2023-02-14 VITALS — BP 130/80 | HR 71 | Temp 97.1°F | Resp 17 | Ht 60.98 in | Wt 217.6 lb

## 2023-02-14 DIAGNOSIS — E1165 Type 2 diabetes mellitus with hyperglycemia: Secondary | ICD-10-CM | POA: Insufficient documentation

## 2023-02-14 DIAGNOSIS — Z6841 Body Mass Index (BMI) 40.0 and over, adult: Secondary | ICD-10-CM | POA: Insufficient documentation

## 2023-02-14 DIAGNOSIS — Z79899 Other long term (current) drug therapy: Secondary | ICD-10-CM | POA: Insufficient documentation

## 2023-02-14 DIAGNOSIS — E782 Mixed hyperlipidemia: Secondary | ICD-10-CM | POA: Insufficient documentation

## 2023-02-14 DIAGNOSIS — Z794 Long term (current) use of insulin: Secondary | ICD-10-CM | POA: Insufficient documentation

## 2023-02-14 DIAGNOSIS — Z7985 Long-term (current) use of injectable non-insulin antidiabetic drugs: Secondary | ICD-10-CM | POA: Insufficient documentation

## 2023-02-14 DIAGNOSIS — I10 Essential (primary) hypertension: Secondary | ICD-10-CM | POA: Insufficient documentation

## 2023-02-14 DIAGNOSIS — R809 Proteinuria, unspecified: Secondary | ICD-10-CM | POA: Insufficient documentation

## 2023-02-14 DIAGNOSIS — G629 Polyneuropathy, unspecified: Secondary | ICD-10-CM | POA: Insufficient documentation

## 2023-02-14 DIAGNOSIS — E669 Obesity, unspecified: Secondary | ICD-10-CM | POA: Insufficient documentation

## 2023-02-14 LAB — BASIC METABOLIC PANEL, FASTING
ANION GAP: 10 mmol/L (ref 4–13)
BUN/CREA RATIO: 11 (ref 6–22)
BUN: 9 mg/dL (ref 8–25)
CALCIUM: 9.6 mg/dL (ref 8.6–10.3)
CHLORIDE: 103 mmol/L (ref 96–111)
CO2 TOTAL: 29 mmol/L (ref 23–31)
CREATININE: 0.79 mg/dL (ref 0.60–1.05)
ESTIMATED GFR - FEMALE: 79 mL/min/BSA (ref >=60)
GLUCOSE: 177 mg/dL — ABNORMAL HIGH (ref 70–99)
POTASSIUM: 4 mmol/L (ref 3.5–5.1)
SODIUM: 142 mmol/L (ref 136–145)

## 2023-02-14 LAB — HGA1C (HEMOGLOBIN A1C WITH EST AVG GLUCOSE)
ESTIMATED AVERAGE GLUCOSE: 189 mg/dL
HEMOGLOBIN A1C: 8.2 % — ABNORMAL HIGH (ref 4.0–6.0)

## 2023-02-14 LAB — THYROID STIMULATING HORMONE (SENSITIVE TSH): TSH: 2.337 u[IU]/mL (ref 0.350–4.940)

## 2023-02-14 MED ORDER — INSULIN DEGLUDEC (U-200) 200 UNIT/ML (3 ML) SUBCUTANEOUS PEN
PEN_INJECTOR | SUBCUTANEOUS | Status: DC
Start: 2023-02-14 — End: 2023-08-31

## 2023-02-14 MED ORDER — INSULIN LISPRO (U-100) 100 UNIT/ML SUBCUTANEOUS PEN
PEN_INJECTOR | SUBCUTANEOUS | Status: DC
Start: 2023-02-14 — End: 2023-06-20

## 2023-02-14 MED ORDER — OZEMPIC 0.25 MG OR 0.5 MG (2 MG/3 ML) SUBCUTANEOUS PEN INJECTOR
PEN_INJECTOR | SUBCUTANEOUS | Status: DC
Start: 2023-02-14 — End: 2023-09-27

## 2023-02-14 NOTE — Progress Notes (Signed)
[place] [facility]  FAMILY MEDICINE, [facility]  [address]  [place] [address]  [phone]   Progress Note    Date of Service:  02/14/2023  Jeanne [name], Jeanne [name], 72 y.o. female  Date of Birth:  1951-04-26  PCP: Adriana Simas, DO     HPI:  DNAJA SLEPPY presents to Endocrinology clinic for a follow-up for Type 2 diabetes complicated by other comorbid conditions including: hypertension, hyperlipidemia and obesity. Diagnosed with diabetes in 1997, at age 28. Family history is Positive for Type 2 diabetes- father.  Most recent A1c is 7.6 - indicative of suboptimal glycemic control.  Review of Dexcom download for 10/29/22-11/11/22 reveals the following:  Average glucose:  216  GMI:  8.5%  In range:  34%  High: 36%  Very high: 29%  Low: Less than <1%  Very low: 0%  Current diabetes medications include:  Tresiba 38 units once daily; Humalog 16 units with brunch and 16 units with dinner plus correction 2:50> 150  Does not  miss medications doses.  Denies difficulty affording medication and/or test strips.   Previous diabetes medication include: Metformin (nausea, diarrhea- even with extended release even with lower doses); Trulicity (nausea); Lantus (rash): Glipizide; Humalog 75/25  Has glucagon at home: Yes  Diabetes Education in the past: No RD in the past - No  Lives with: family  Weight has: decreased  Any hospitalizations r/t to diabetes over the last year: No  Chronic Complications:  Microvascular - no known history of retinopathy, nephropathy, positive for neuropathy   Macrovascular- positive hx of MI, negative for CVA and TIA  Autonomic Dysfunction - denies gastroparesis, or bowel/bladder dysfunction  Denies foot ulcers, deformities. Does perform foot care at home - follows with podiatry in [place] every 3 months  Experiences hypoglycemia: Yes. Symptoms of hypoglycemia present during an event: lethargy, sweats and shakes Experiences hypoglycemia:  rarely  Up to date with opthamology exam: Yes, 09/2021, Dr. Marinda Elk, [place], [place]  Steroids recently: No  History of frequent UTI: No  History of frequent genital yeast infection: No  History of pancreatitis: No  Personal or family Multiple Endocrine Neoplasia Type 2 (MEN2): No     Glycemic control appears to have worsened based off CGM.  She is not taking prescribed doses of Humalog but instead is using 16 units with brunch and supper.  She has not started the Ozempic as she has been fearful to do so.  Has had some nocturnal hypoglycemia.    I have personally reviewed allergies, chief complaint, medications, past medical history, surgical history, and family history.  I have reviewed referral notes, PCP progress notes and labs relevant to this visit.    Past Medical History:   Diagnosis Date    Clouded crystalline lens     Contact dermatitis and eczema     Depressive disorder     Diverticulitis     Generalized anxiety disorder     High cholesterol     HTN (hypertension)     Hyperglycemia     Hypomagnesemia     IBS (irritable bowel syndrome)     Mixed hyperlipidemia     Premature atrial contraction     T2DM (type 2 diabetes mellitus) (CMS HCC)     Unspecified cataract     Vision loss     Vitamin D deficiency       Past Surgical History:   Procedure Laterality Date    CESAREAN SECTION  HX CATARACT REMOVAL      HX HYSTERECTOMY  1983    HX LAP CHOLECYSTECTOMY      HX PARTIAL HYSTERECTOMY      INCONTINENCE SURGERY  12/09/2021    KNEE SURGERY      Knee replacements    SHOULDER SURGERY        Social History     Tobacco Use    Smoking status: Never     Passive exposure: Never    Smokeless tobacco: Never   Vaping Use    Vaping status: Never Used   Substance Use Topics    Alcohol use: Never    Drug use: Never       Family Medical History:       Problem Relation (Age of Onset)    Anesthesia Complications Mother    Asthma Brother    Cancer Mother    Cervical Cancer Mother    Diabetes Father    Hypertension (High Blood  Pressure) Father    Lung disease Father           Outpatient Medications Marked as Taking for the 02/14/23 encounter (Office Visit) with [name], APRN   Medication Sig    celecoxib (CELEBREX) 200 mg Oral Capsule Take 1 Capsule (200 mg total) by mouth Once a day    ergocalciferol, vitamin D2, (DRISDOL) 1,250 mcg (50,000 unit) Oral Capsule Take 1 Capsule (50,000 Units total) by mouth Every 7 days    insulin degludec 200 unit/mL (3 mL) Subcutaneous Insulin Pen Inject 32 units once daily, adjust as directed, MDD 60    insulin lispro (HUMALOG KWIKPEN INSULIN) 100 unit/mL Subcutaneous Insulin Pen Inject 16 units with bunch and 16 units dinner plus correction 2 units for every 50 greater than 150 with meals, MDD 50    losartan (COZAAR) 50 mg Oral Tablet Take 2 Tablets (100 mg total) by mouth Once a day    magnesium oxide (MAG-OX) 400 mg Oral Tablet Take 1 Tablet (400 mg total) by mouth Once a day    Pen Needle, Disposable, 32 gauge x 5/32" Needle To use 4 times daily with insulin    semaglutide (OZEMPIC) 0.25 mg or 0.5 mg (2 mg/3 mL) Subcutaneous Pen Injector Inject 0.25mg  once weekly for 4 weeks then increase to 0.5mg  once weekly    sertraline (ZOLOFT) 100 mg Oral Tablet Take 1 Tablet (100 mg total) by mouth Once a day    torsemide (DEMADEX) 10 mg Oral Tablet Take 1 Tablet (10 mg total) by mouth Once a day      Allergies   Allergen Reactions    Penicillins Anaphylaxis    Dulaglutide Rash    Insulin Glargine Rash and Hives/ Urticaria    Lisinopril  Other Adverse Reaction (Add comment)     Constant cough    Statins-Hmg-Coa Reductase Inhibitors Diarrhea, Nausea/ Vomiting and Myalgia     Duricef(Cefadroxil), Trulicity Solution Pen Injector.        Review of Systems:  Review of Systems   Constitutional:  Negative for malaise/fatigue.   Eyes:  Negative for blurred vision.   Respiratory:  Negative for shortness of breath.    Cardiovascular:  Negative for chest pain and leg swelling.   Gastrointestinal:  Negative for  abdominal pain, diarrhea, nausea and vomiting.   Genitourinary:  Negative for frequency.   Endo/Heme/Allergies:  Negative for polydipsia.          BP 130/80   Pulse 71   Temp 36.2 C (97.1 F)  Resp 17   Ht 1.549 m (5' 0.98")   Wt 98.7 kg (217 lb 9.6 oz)   SpO2 93%   BMI 41.14 kg/m       Physical Exam  Vitals reviewed.   Constitutional:       Appearance: Normal appearance. She is obese.   HENT:      Head: Normocephalic.   Cardiovascular:      Rate and Rhythm: Normal rate and regular rhythm.   Pulmonary:      Effort: Pulmonary effort is normal.      Breath sounds: Normal breath sounds.   Musculoskeletal:      Cervical back: Neck supple.   Skin:     General: Skin is warm and dry.   Neurological:      General: No focal deficit present.      Mental Status: She is alert and oriented to person, place, and time.            Data Reviewed:    A1C: 7.6  A1C Date: 11/06/2022  COMPREHENSIVE METABOLIC PANEL   Lab Results   Component Value Date    SODIUM 142 02/14/2023    POTASSIUM 4.0 02/14/2023    CHLORIDE 103 02/14/2023    CO2 29 02/14/2023    ANIONGAP 10 02/14/2023    BUN 9 02/14/2023    CREATININE 0.79 02/14/2023    CALCIUM 9.6 02/14/2023    ALBUMIN 3.6 11/06/2022    TOTALPROTEIN 7.2 11/06/2022    ALKPHOS 82 11/06/2022    AST 11 11/06/2022    ALT 6 (L) 11/06/2022         LIPID PROFILE  Lab Results   Component Value Date    CHOLESTEROL 189 11/06/2022    HDLCHOL 48 (L) 11/06/2022    LDLCHOL 124 (H) 11/06/2022    TRIG 95 11/06/2022       THYROID STIMULATING HORMONE  Lab Results   Component Value Date    TSH 1.254 04/25/2022        Urine Microalb/cr ratio   Lab Results   Component Value Date/Time    MICALBCRERAT 41.6 (H) 11/06/2022 10:47 AM                 Outside Lab Results 09/23/21:  HgbA1C; 8.4  GFR: 100  GFR: 0.5  Albumin/creatinine: 56.3        Assessment/Plan:  Assessment/Plan   1. Type 2 diabetes mellitus with hyperglycemia, with long-term current use of insulin (CMS HCC)    2. Mixed hyperlipidemia    3.  Essential hypertension         ICD-10-CM    1. Type 2 diabetes mellitus with hyperglycemia, with long-term current use of insulin (CMS HCC)  E11.65 THYROID STIMULATING HORMONE (SENSITIVE TSH)    Z79.4 COMPREHENSIVE METABOLIC PANEL, NON-FASTING     HGA1C (HEMOGLOBIN A1C WITH EST AVG GLUCOSE)     LIPID PANEL     MICROALBUMIN/CREATININE RATIO, URINE, RANDOM     semaglutide (OZEMPIC) 0.25 mg or 0.5 mg (2 mg/3 mL) Subcutaneous Pen Injector     insulin degludec 200 unit/mL (3 mL) Subcutaneous Insulin Pen     insulin lispro (HUMALOG KWIKPEN INSULIN) 100 unit/mL Subcutaneous Insulin Pen      2. Mixed hyperlipidemia  E78.2       3. Essential hypertension  I10          Orders Placed This Encounter    THYROID STIMULATING HORMONE (SENSITIVE TSH)    COMPREHENSIVE METABOLIC PANEL, NON-FASTING  HGA1C (HEMOGLOBIN A1C WITH EST AVG GLUCOSE)    LIPID PANEL    MICROALBUMIN/CREATININE RATIO, URINE, RANDOM    semaglutide (OZEMPIC) 0.25 mg or 0.5 mg (2 mg/3 mL) Subcutaneous Pen Injector    insulin degludec 200 unit/mL (3 mL) Subcutaneous Insulin Pen    insulin lispro (HUMALOG KWIKPEN INSULIN) 100 unit/mL Subcutaneous Insulin Pen      Return in about 3 months (around 05/17/2023).    Type 2 diabetes - reviewed Dexcom download with this patient and daughter. Decrease Tresiba to 32 units once daily.  Continue Humalog 16 units with brunch and supper plus correction scale.  Is going to start Ozempic 0.25mg  once weekly for 4 weeks then increase to 0.5mg  weekly as previously prescribed.  Will follow up with CDE in 5-6 weeks for Dexcom download and generalized diabetic education.  Hypertension - at goal, continue management per PCP  Hyperlipidemia - LDL not at goal, however improved, intolerant to statins d/t myalgias  Neuropathy - reviewed importance of foot care and proper shoes, follows with podiatry regularly  Obesity - to continue with weight loss    Microabuminuria-  needs tighter glucose control.  Continue Losartan      On the day of the  encounter, a total of  30 minutes was spent on this patient encounter including review of historical information, examination, documentation and post-visit activities.      This note may have been partially generated using MModal Fluency Direct system, and there may be some incorrect words, spellings, and punctuation that were not noted in checking the note before saving.    [name], APRN

## 2023-02-14 NOTE — Patient Instructions (Signed)
-  Decrease Tresiba to 32 units once daily  -Continue Humalog 16 units with brunch and supper plus correction scale  -Start Ozempic 0.25mg  once weekly for 4 weeks then increase to 0.5mg  once weekly  -Continue Dexcom use  -Follow up with CDE in 5-6 weeks  -Call for any questions or concerns  -Call for 2 or more blood sugars less than 80 in one week, or any noted pattern of high or low blood sugars        Hypoglycemia (Low Blood Sugar/ Under 80)  How to avoid hypoglycemia:  1. Eat meals and snacks on time  2. Check blood sugar regularly  3. Be mindful of activity and ALWAYS check blood sugar before activity/exercise and before driving  4. ALWAYS have a fast acting source of glucose with you at all times (glucose tablets, glucose gel, smarties, hard candy, or juice box)  TREATMENT  1. Test Blood Sugar at the fingertip - if unable to test blood sugar - treat like it is low  2. Treatment - Rule of 15 eat or drink 15 grams of fast acting carbs (example: 1/2 cup of juice or regular soda, 1 cup low fat or skim milk, 4 glucose tablets, 6 lifesavers)  *If unable to eat use glucagon injection AND call 911  3. Recheck blood sugar in 15 minutes  If blood sugar is less than 80- repeat rule of 15 (as above)  If blood sugar is greater than 80 - be mindful, continue checking blood sugar

## 2023-03-20 ENCOUNTER — Ambulatory Visit (RURAL_HEALTH_CENTER): Payer: Self-pay | Admitting: NURSE PRACTITIONER

## 2023-05-23 LAB — POCT HGB A1C: POCT HGB A1C: 7.6 % — AB (ref 4–6)

## 2023-05-23 LAB — HM EGFR: GFR: 86 mL/min/{1.73_m2}

## 2023-06-01 ENCOUNTER — Other Ambulatory Visit (RURAL_HEALTH_CENTER): Payer: Self-pay

## 2023-06-14 ENCOUNTER — Other Ambulatory Visit: Payer: Self-pay

## 2023-06-14 ENCOUNTER — Other Ambulatory Visit: Payer: Medicare Other | Attending: NURSE PRACTITIONER

## 2023-06-14 DIAGNOSIS — Z794 Long term (current) use of insulin: Secondary | ICD-10-CM | POA: Insufficient documentation

## 2023-06-14 DIAGNOSIS — E1165 Type 2 diabetes mellitus with hyperglycemia: Secondary | ICD-10-CM | POA: Insufficient documentation

## 2023-06-14 LAB — COMPREHENSIVE METABOLIC PANEL, NON-FASTING
ALBUMIN: 3.5 g/dL (ref 3.4–4.8)
ALKALINE PHOSPHATASE: 85 U/L (ref 55–145)
ALT (SGPT): <7 U/L (ref <31)
ANION GAP: 8 mmol/L (ref 4–13)
AST (SGOT): 18 U/L (ref 11–34)
BILIRUBIN TOTAL: 0.7 mg/dL (ref 0.3–1.3)
BUN/CREA RATIO: 14 (ref 6–22)
BUN: 10 mg/dL (ref 8–25)
CALCIUM: 9.6 mg/dL (ref 8.6–10.3)
CHLORIDE: 106 mmol/L (ref 96–111)
CO2 TOTAL: 28 mmol/L (ref 23–31)
CREATININE: 0.73 mg/dL (ref 0.60–1.05)
ESTIMATED GFR - FEMALE: 87 mL/min/BSA (ref >=60)
GLUCOSE: 160 mg/dL — ABNORMAL HIGH (ref 65–125)
POTASSIUM: 3.7 mmol/L (ref 3.5–5.1)
PROTEIN TOTAL: 6.9 g/dL (ref 6.0–8.0)
SODIUM: 142 mmol/L (ref 136–145)

## 2023-06-14 LAB — MICROALBUMIN/CREATININE RATIO, URINE, RANDOM
CREATININE RANDOM URINE: 245 mg/dL
MICROALBUMIN RANDOM URINE: 6.4 mg/dL
MICROALBUMIN/CREATININE RATIO RANDOM URINE: 26.1 mg/g (ref <30.0)

## 2023-06-14 LAB — HGA1C (HEMOGLOBIN A1C WITH EST AVG GLUCOSE)
ESTIMATED AVERAGE GLUCOSE: 180 mg/dL
HEMOGLOBIN A1C: 7.9 % — ABNORMAL HIGH (ref 4.0–6.0)

## 2023-06-14 LAB — LIPID PANEL
CHOL/HDL RATIO: 4.6
CHOLESTEROL: 212 mg/dL — ABNORMAL HIGH (ref 100–200)
HDL CHOL: 46 mg/dL — ABNORMAL LOW (ref >=50)
LDL CALC: 145 mg/dL — ABNORMAL HIGH (ref <100)
NON-HDL: 166 mg/dL (ref <=190)
TRIGLYCERIDES: 117 mg/dL (ref <150)
VLDL CALC: 21 mg/dL (ref <30)

## 2023-06-20 ENCOUNTER — Other Ambulatory Visit: Payer: Self-pay

## 2023-06-20 ENCOUNTER — Encounter (RURAL_HEALTH_CENTER): Payer: Self-pay | Admitting: NURSE PRACTITIONER

## 2023-06-20 ENCOUNTER — Ambulatory Visit: Payer: Medicare Other | Attending: NURSE PRACTITIONER | Admitting: NURSE PRACTITIONER

## 2023-06-20 VITALS — BP 156/88 | HR 79 | Temp 97.1°F | Resp 17 | Ht 60.98 in | Wt 216.0 lb

## 2023-06-20 DIAGNOSIS — Z794 Long term (current) use of insulin: Secondary | ICD-10-CM | POA: Insufficient documentation

## 2023-06-20 DIAGNOSIS — Z7985 Long-term (current) use of injectable non-insulin antidiabetic drugs: Secondary | ICD-10-CM | POA: Insufficient documentation

## 2023-06-20 DIAGNOSIS — R809 Proteinuria, unspecified: Secondary | ICD-10-CM | POA: Insufficient documentation

## 2023-06-20 DIAGNOSIS — I10 Essential (primary) hypertension: Secondary | ICD-10-CM | POA: Insufficient documentation

## 2023-06-20 DIAGNOSIS — Z79899 Other long term (current) drug therapy: Secondary | ICD-10-CM | POA: Insufficient documentation

## 2023-06-20 DIAGNOSIS — E1140 Type 2 diabetes mellitus with diabetic neuropathy, unspecified: Secondary | ICD-10-CM | POA: Insufficient documentation

## 2023-06-20 DIAGNOSIS — E669 Obesity, unspecified: Secondary | ICD-10-CM | POA: Insufficient documentation

## 2023-06-20 DIAGNOSIS — E1165 Type 2 diabetes mellitus with hyperglycemia: Secondary | ICD-10-CM | POA: Insufficient documentation

## 2023-06-20 DIAGNOSIS — Z6841 Body Mass Index (BMI) 40.0 and over, adult: Secondary | ICD-10-CM | POA: Insufficient documentation

## 2023-06-20 DIAGNOSIS — E782 Mixed hyperlipidemia: Secondary | ICD-10-CM | POA: Insufficient documentation

## 2023-06-20 LAB — LAB: COLOGUARD RESULT: NEGATIVE

## 2023-06-20 LAB — COLOGUARD® COLON CANCER SCREEN: COLOGUARD RESULT: NEGATIVE

## 2023-06-20 MED ORDER — INSULIN LISPRO (U-100) 100 UNIT/ML SUBCUTANEOUS PEN
PEN_INJECTOR | SUBCUTANEOUS | Status: DC
Start: 2023-06-20 — End: 2023-08-31

## 2023-06-20 NOTE — Progress Notes (Signed)
[place] [facility]  FAMILY MEDICINE, [facility]  [address]  [place] [address]  [phone]   Progress Note    Date of Service:  06/20/2023  Jeanne [name], Jeanne [name], 72 y.o. [name]  Date of Birth:  1950/11/24  PCP: Adriana Simas, DO     HPI:  Jeanne [name] presents to Endocrinology clinic for a follow-up for Type 2 diabetes complicated by other comorbid conditions including: hypertension, hyperlipidemia and obesity. Diagnosed with diabetes in 1997, at age 6. Family history is Positive for Type 2 diabetes- father.  Most recent A1c is 7.9 - indicative of suboptimal glycemic control.  Review of Dexcom download for the last 2 weeks reveals the following:  Average glucose:  205  GMI:  8.2%  In range:  42%  High: 35%  Very high: 23%  Low: Less than 0%  Very low: <1%  Current diabetes medications include:  Tresiba 34 units once daily; Humalog 16 units with brunch and 16 units with dinner plus correction 2:50> 150  Does not  miss medications doses.  Denies difficulty affording medication and/or test strips.   Previous diabetes medication include: Metformin (nausea, diarrhea- even with extended release even with lower doses); Trulicity (nausea); Lantus (rash): Glipizide; Humalog 75/25  Has glucagon at home: Yes  Diabetes Education in the past: No RD in the past - No  Lives with: family  Weight has: decreased  Any hospitalizations r/t to diabetes over the last year: No  Chronic Complications:  Microvascular - no known history of retinopathy, nephropathy, positive for neuropathy   Macrovascular- positive hx of MI, negative for CVA and TIA  Autonomic Dysfunction - denies gastroparesis, or bowel/bladder dysfunction  Denies foot ulcers, deformities. Does perform foot care at home - follows with podiatry in [place] every 3 months  Experiences hypoglycemia: Yes. Symptoms of hypoglycemia present during an event: lethargy, sweats and shakes Experiences hypoglycemia:  rarely  Up to date with opthamology exam: Yes, 09/2022, Dr. Marinda Elk, [place], [place]  Steroids recently: No  History of frequent UTI: No  History of frequent genital yeast infection: No  History of pancreatitis: No  Personal or family Multiple Endocrine Neoplasia Type 2 (MEN2): No     Glycemic control improved.  Unfortunately, Jeanne [name] did not start the Ozempic.  After talking with family members she was even more scared of side effects.  She has been taking [place] 34 units and not 32 units as prescribed.  Occasional fasting hypoglycemia.      I have personally reviewed allergies, chief complaint, medications, past medical history, surgical history, and family history.  I have reviewed referral notes, PCP progress notes and labs relevant to this visit.    Past Medical History:   Diagnosis Date    Clouded crystalline lens     Contact dermatitis and eczema     Depressive disorder     Diverticulitis     Generalized anxiety disorder     High cholesterol     HTN (hypertension)     Hyperglycemia     Hypomagnesemia     IBS (irritable bowel syndrome)     Mixed hyperlipidemia     Premature atrial contraction     T2DM (type 2 diabetes mellitus) (CMS HCC)     Unspecified cataract     Vision loss     Vitamin D deficiency       Past Surgical History:   Procedure Laterality Date    CESAREAN SECTION  HX CATARACT REMOVAL      HX HYSTERECTOMY  1983    HX LAP CHOLECYSTECTOMY      HX PARTIAL HYSTERECTOMY      INCONTINENCE SURGERY  12/09/2021    KNEE SURGERY      Knee replacements    SHOULDER SURGERY        Social History     Tobacco Use    Smoking status: Never     Passive exposure: Never    Smokeless tobacco: Never   Vaping Use    Vaping status: Never Used   Substance Use Topics    Alcohol use: Never    Drug use: Never       Family Medical History:       Problem Relation (Age of Onset)    Anesthesia Complications Mother    Asthma Brother    Cancer Mother    Cervical Cancer Mother    Diabetes Father    Hypertension (High Blood Pressure)  Father    Lung disease Father           Outpatient Medications Marked as Taking for the 06/20/23 encounter (Office Visit) with [name], APRN   Medication Sig    celecoxib (CELEBREX) 200 mg Oral Capsule Take 1 Capsule (200 mg total) by mouth Once a day    clotrimazole-betamethasone (LOTRISONE) 1-0.05 % Cream Apply topically Twice daily    ergocalciferol, vitamin D2, (DRISDOL) 1,250 mcg (50,000 unit) Oral Capsule Take 1 Capsule (50,000 Units total) by mouth Every 7 days    insulin degludec 200 unit/mL (3 mL) Subcutaneous Insulin Pen Inject 32 units once daily, adjust as directed, MDD 60    insulin lispro (HUMALOG KWIKPEN INSULIN) 100 unit/mL Subcutaneous Insulin Pen Inject 18 units with bunch and 18 units dinner plus correction 2 units for every 50 greater than 150 with meals, MDD 50    ketoconazole (NIZORAL) 2 % Cream Apply topically Twice daily    losartan (COZAAR) 50 mg Oral Tablet Take 2 Tablets (100 mg total) by mouth Once a day    magnesium oxide (MAG-OX) 400 mg Oral Tablet Take 1 Tablet (400 mg total) by mouth Once a day    Pen Needle, Disposable, 32 gauge x 5/32" Needle To use 4 times daily with insulin    semaglutide (OZEMPIC) 0.25 mg or 0.5 mg (2 mg/3 mL) Subcutaneous Pen Injector Inject 0.25mg  once weekly for 4 weeks then increase to 0.5mg  once weekly    sertraline (ZOLOFT) 100 mg Oral Tablet Take 1 Tablet (100 mg total) by mouth Once a day    torsemide (DEMADEX) 10 mg Oral Tablet Take 1 Tablet (10 mg total) by mouth Once a day      Allergies   Allergen Reactions    Penicillins Anaphylaxis    Dulaglutide Rash    Insulin Glargine Rash and Hives/ Urticaria    Lisinopril  Other Adverse Reaction (Add comment)     Constant cough    Statins-Hmg-Coa Reductase Inhibitors Diarrhea, Nausea/ Vomiting and Myalgia     Duricef(Cefadroxil), Trulicity Solution Pen Injector.        Review of Systems:  Review of Systems   Constitutional:  Negative for malaise/fatigue.   Eyes:  Negative for blurred vision.   Respiratory:   Negative for shortness of breath.    Cardiovascular:  Negative for chest pain and leg swelling.   Gastrointestinal:  Negative for abdominal pain, diarrhea, nausea and vomiting.   Genitourinary:  Negative for frequency.   Endo/Heme/Allergies:  Negative for  polydipsia.          BP (!) 156/88   Pulse 79   Temp 36.2 C (97.1 F) (Temporal)   Resp 17   Ht 1.549 m (5' 0.98")   Wt 98 kg (216 lb)   SpO2 91%   BMI 40.83 kg/m       Physical Exam  Vitals reviewed.   Constitutional:       Appearance: Normal appearance. She is obese.   HENT:      Head: Normocephalic.   Cardiovascular:      Rate and Rhythm: Normal rate and regular rhythm.   Pulmonary:      Effort: Pulmonary effort is normal.      Breath sounds: Normal breath sounds.   Musculoskeletal:      Cervical back: Neck supple.   Skin:     General: Skin is warm and dry.   Neurological:      General: No focal deficit present.      Mental Status: She is alert and oriented to person, place, and time.            Data Reviewed:    A1C: 7.9  A1C Date: 06/14/2023  COMPREHENSIVE METABOLIC PANEL   Lab Results   Component Value Date    SODIUM 142 06/14/2023    POTASSIUM 3.7 06/14/2023    CHLORIDE 106 06/14/2023    CO2 28 06/14/2023    ANIONGAP 8 06/14/2023    BUN 10 06/14/2023    CREATININE 0.73 06/14/2023    CALCIUM 9.6 06/14/2023    ALBUMIN 3.5 06/14/2023    TOTALPROTEIN 6.9 06/14/2023    ALKPHOS 85 06/14/2023    AST 18 06/14/2023    ALT <7 06/14/2023         LIPID PROFILE  Lab Results   Component Value Date    CHOLESTEROL 212 (H) 06/14/2023    HDLCHOL 46 (L) 06/14/2023    LDLCHOL 145 (H) 06/14/2023    TRIG 117 06/14/2023       THYROID STIMULATING HORMONE  Lab Results   Component Value Date    TSH 2.337 02/14/2023        Urine Microalb/cr ratio   Lab Results   Component Value Date/Time    MICALBCRERAT 26.1 06/14/2023 08:52 AM                 Outside Lab Results 09/23/21:  HgbA1C; 8.4  GFR: 100  GFR: 0.5  Albumin/creatinine: 56.3        Assessment/Plan:  Assessment/Plan    1. Type 2 diabetes mellitus with hyperglycemia, with long-term current use of insulin (CMS HCC)    2. Mixed hyperlipidemia    3. Essential hypertension           ICD-10-CM    1. Type 2 diabetes mellitus with hyperglycemia, with long-term current use of insulin (CMS HCC)  E11.65 insulin lispro (HUMALOG KWIKPEN INSULIN) 100 unit/mL Subcutaneous Insulin Pen    Z79.4 BASIC METABOLIC PANEL     HGA1C (HEMOGLOBIN A1C WITH EST AVG GLUCOSE)     CANCELED: BASIC METABOLIC PANEL     CANCELED: HGA1C (HEMOGLOBIN A1C WITH EST AVG GLUCOSE)      2. Mixed hyperlipidemia  E78.2       3. Essential hypertension  I10            Orders Placed This Encounter    CANCELED: BASIC METABOLIC PANEL    CANCELED: HGA1C (HEMOGLOBIN A1C WITH EST AVG GLUCOSE)    BASIC METABOLIC  PANEL    HGA1C (HEMOGLOBIN A1C WITH EST AVG GLUCOSE)    insulin lispro (HUMALOG KWIKPEN INSULIN) 100 unit/mL Subcutaneous Insulin Pen      Return in about 3 months (around 09/19/2023).    Type 2 diabetes - reviewed Dexcom download with this patient and daughter. Decrease Tresiba to 32 units once daily as previously prescribed.  Increase Humalog to 18 units with brunch and supper plus correction scale.  Follow up with CDE in 2-3 weeks to review Dexcom download and for basic diabetes/nutrition therapy.  Pt needs more education on keeping meals CHO consistent.  Hypertension - not at goal, daughter reports pt just took her medication, continue management per PCP  Hyperlipidemia - LDL not at goal,  intolerant to statins d/t myalgias  Neuropathy - reviewed importance of foot care and proper shoes, follows with podiatry regularly  Obesity - to continue with weight loss    Microabuminuria-  Improved and within goal.  Still needs tighter glucose control.  Continue Losartan      On the day of the encounter, a total of  30 minutes was spent on this patient encounter including review of historical information, examination, documentation and post-visit activities.      This note may have  been partially generated using MModal Fluency Direct system, and there may be some incorrect words, spellings, and punctuation that were not noted in checking the note before saving.    [name], APRN

## 2023-06-20 NOTE — Patient Instructions (Addendum)
-  Decrease Tresiba to 32 units once daily  -Increase Humalog to 18 units with brunch and supper and continue correction scale  -Follow up with Jill Side, diabetes educator in 2-3 weeks  -Follow up with Lowella Bandy in 3 months, with labs before  -Call for any questions or concerns  -Call for 2 or more blood sugars less than 80 in one week, or any noted pattern of high or low blood sugars      Hypoglycemia (Low Blood Sugar/ Under 80)  How to avoid hypoglycemia:  1. Eat meals and snacks on time  2. Check blood sugar regularly  3. Be mindful of activity and ALWAYS check blood sugar before activity/exercise and before driving  4. ALWAYS have a fast acting source of glucose with you at all times (glucose tablets, glucose gel, smarties, hard candy, or juice box)  TREATMENT  1. Test Blood Sugar at the fingertip - if unable to test blood sugar - treat like it is low  2. Treatment - Rule of 15 eat or drink 15 grams of fast acting carbs (example: 1/2 cup of juice or regular soda, 1 cup low fat or skim milk, 4 glucose tablets, 6 lifesavers)  *If unable to eat use glucagon injection AND call 911  3. Recheck blood sugar in 15 minutes  If blood sugar is less than 80- repeat rule of 15 (as above)  If blood sugar is greater than 80 - be mindful, continue checking blood sugar

## 2023-08-31 ENCOUNTER — Other Ambulatory Visit (RURAL_HEALTH_CENTER): Payer: Self-pay | Admitting: NURSE PRACTITIONER

## 2023-08-31 DIAGNOSIS — E1165 Type 2 diabetes mellitus with hyperglycemia: Secondary | ICD-10-CM

## 2023-08-31 NOTE — Telephone Encounter (Signed)
Patient called requests refill. Nmt, lpn

## 2023-09-03 MED ORDER — INSULIN LISPRO (U-100) 100 UNIT/ML SUBCUTANEOUS PEN
PEN_INJECTOR | SUBCUTANEOUS | 5 refills | Status: DC
Start: 2023-09-03 — End: 2023-09-27

## 2023-09-03 MED ORDER — INSULIN DEGLUDEC (U-200) 200 UNIT/ML (3 ML) SUBCUTANEOUS PEN
PEN_INJECTOR | SUBCUTANEOUS | 5 refills | Status: DC
Start: 2023-09-03 — End: 2023-09-27

## 2023-09-21 ENCOUNTER — Other Ambulatory Visit: Payer: Self-pay

## 2023-09-21 ENCOUNTER — Other Ambulatory Visit: Payer: Medicare Other | Attending: NURSE PRACTITIONER

## 2023-09-21 DIAGNOSIS — Z794 Long term (current) use of insulin: Secondary | ICD-10-CM | POA: Insufficient documentation

## 2023-09-21 DIAGNOSIS — E1165 Type 2 diabetes mellitus with hyperglycemia: Secondary | ICD-10-CM | POA: Insufficient documentation

## 2023-09-21 LAB — BASIC METABOLIC PANEL
ANION GAP: 11 mmol/L (ref 4–13)
BUN/CREA RATIO: 13 (ref 6–22)
BUN: 10 mg/dL (ref 8–25)
CALCIUM: 9.3 mg/dL (ref 8.6–10.3)
CHLORIDE: 103 mmol/L (ref 96–111)
CO2 TOTAL: 28 mmol/L (ref 23–31)
CREATININE: 0.76 mg/dL (ref 0.60–1.05)
ESTIMATED GFR - FEMALE: 83 mL/min/BSA (ref >=60)
GLUCOSE: 137 mg/dL — ABNORMAL HIGH (ref 65–125)
POTASSIUM: 3 mmol/L — ABNORMAL LOW (ref 3.5–5.1)
SODIUM: 142 mmol/L (ref 136–145)

## 2023-09-21 LAB — HGA1C (HEMOGLOBIN A1C WITH EST AVG GLUCOSE)
ESTIMATED AVERAGE GLUCOSE: 169 mg/dL
HEMOGLOBIN A1C: 7.5 % — ABNORMAL HIGH (ref 4.0–6.0)

## 2023-09-27 ENCOUNTER — Encounter (INDEPENDENT_AMBULATORY_CARE_PROVIDER_SITE_OTHER): Payer: Self-pay | Admitting: NURSE PRACTITIONER

## 2023-09-27 ENCOUNTER — Other Ambulatory Visit: Payer: Self-pay

## 2023-09-27 ENCOUNTER — Ambulatory Visit: Payer: Medicare Other | Attending: NURSE PRACTITIONER | Admitting: NURSE PRACTITIONER

## 2023-09-27 VITALS — BP 120/82 | HR 68 | Resp 17 | Ht 60.98 in | Wt 208.9 lb

## 2023-09-27 DIAGNOSIS — E1140 Type 2 diabetes mellitus with diabetic neuropathy, unspecified: Secondary | ICD-10-CM | POA: Insufficient documentation

## 2023-09-27 DIAGNOSIS — Z794 Long term (current) use of insulin: Secondary | ICD-10-CM | POA: Insufficient documentation

## 2023-09-27 DIAGNOSIS — E782 Mixed hyperlipidemia: Secondary | ICD-10-CM | POA: Insufficient documentation

## 2023-09-27 DIAGNOSIS — I10 Essential (primary) hypertension: Secondary | ICD-10-CM | POA: Insufficient documentation

## 2023-09-27 DIAGNOSIS — Z6839 Body mass index (BMI) 39.0-39.9, adult: Secondary | ICD-10-CM | POA: Insufficient documentation

## 2023-09-27 DIAGNOSIS — E669 Obesity, unspecified: Secondary | ICD-10-CM | POA: Insufficient documentation

## 2023-09-27 DIAGNOSIS — Z833 Family history of diabetes mellitus: Secondary | ICD-10-CM | POA: Insufficient documentation

## 2023-09-27 DIAGNOSIS — E1165 Type 2 diabetes mellitus with hyperglycemia: Secondary | ICD-10-CM | POA: Insufficient documentation

## 2023-09-27 DIAGNOSIS — E876 Hypokalemia: Secondary | ICD-10-CM | POA: Insufficient documentation

## 2023-09-27 DIAGNOSIS — Z79899 Other long term (current) drug therapy: Secondary | ICD-10-CM | POA: Insufficient documentation

## 2023-09-27 MED ORDER — POTASSIUM CHLORIDE ER 10 MEQ CAPSULE,EXTENDED RELEASE
10.0000 meq | ORAL_CAPSULE | Freq: Two times a day (BID) | ORAL | 0 refills | Status: DC
Start: 2023-09-27 — End: 2024-04-30

## 2023-09-27 MED ORDER — INSULIN DEGLUDEC (U-200) 200 UNIT/ML (3 ML) SUBCUTANEOUS PEN
PEN_INJECTOR | SUBCUTANEOUS | 5 refills | Status: DC
Start: 2023-09-27 — End: 2024-03-05

## 2023-09-27 MED ORDER — INSULIN LISPRO (U-100) 100 UNIT/ML SUBCUTANEOUS PEN
PEN_INJECTOR | SUBCUTANEOUS | 5 refills | Status: DC
Start: 2023-09-27 — End: 2023-12-03

## 2023-09-27 NOTE — Progress Notes (Signed)
Jeanne [name]  FAMILY MEDICINE, [facility]  [address]  [place] [address]  [phone]   Progress Note    Date of Service:  09/27/2023  Jeanne, [name], 73 y.o. female  Date of Birth:  21-Jul-1951  PCP: Vertell Novak, NP     HPI:  Jeanne [name] presents to Endocrinology clinic for a follow-up for Type 2 diabetes complicated by other comorbid conditions including: hypertension, hyperlipidemia and obesity. Diagnosed with diabetes in 1997, at age 58. Family history is Positive for Type 2 diabetes- father.  Most recent A1c is 7.5 - indicative of suboptimal glycemic control.  Review of Dexcom download for the last 2 weeks reveals the following:  Average glucose:  212  GMI:  8.4%  In range:  26%  High: 48%  Very high: 26%  Low: Less than 0%  Very low: 0%  Current diabetes medications include:  Tresiba 32 units once daily; Humalog 18 units with brunch and 18 units with dinner plus correction 2:50> 150  Does not  miss medications doses.  Denies difficulty affording medication and/or test strips.   Previous diabetes medication include: Metformin (nausea, diarrhea- even with extended release even with lower doses); Trulicity (nausea); Lantus (rash): Glipizide; Humalog 75/25  Has glucagon at home: Yes  Diabetes Education in the past: No RD in the past - No  Lives with: family  Weight has: decreased  Any hospitalizations r/t to diabetes over the last year: No  Chronic Complications:  Microvascular - no known history of retinopathy, nephropathy, positive for neuropathy   Macrovascular- positive hx of MI, negative for CVA and TIA  Autonomic Dysfunction - denies gastroparesis, or bowel/bladder dysfunction  Denies foot ulcers, deformities. Does perform foot care at home - follows with podiatry in [place] every 3 months  Experiences hypoglycemia: Yes. Symptoms of hypoglycemia present during an event: lethargy, sweats and shakes Experiences  hypoglycemia: rarely  Up to date with opthamology exam: Yes, 09/2022, Dr. Marinda Elk, [place], [place]  Steroids recently: No  History of frequent UTI: No  History of frequent genital yeast infection: No  History of pancreatitis: No  Personal or family Multiple Endocrine Neoplasia Type 2 (MEN2): No     06/19/23: Glycemic control improved.  Unfortunately, Jeanne [name] did not start the Ozempic.  After talking with family members she was even more scared of side effects.  She has been taking [place] 34 units and not 32 units as prescribed.  Occasional fasting hypoglycemia.    09/27/23: Glycemic control has continued to improve per last labs.  Pt currently being treated for a diverticulitis flare and daughter reports she has not been eating as much and because she has not been eating as much sometimes she skips her prandial insulin and fear of hypoglycemia.    I have personally reviewed allergies, chief complaint, medications, past medical history, surgical history, and family history.  I have reviewed referral notes, PCP progress notes and labs relevant to this visit.    Past Medical History:   Diagnosis Date    Clouded crystalline lens     Contact dermatitis and eczema     Depressive disorder     Diverticulitis     Generalized anxiety disorder     High cholesterol     HTN (hypertension)     Hyperglycemia     Hypomagnesemia     IBS (irritable bowel syndrome)     Mixed hyperlipidemia     Premature atrial contraction  T2DM (type 2 diabetes mellitus) (CMS HCC)     Unspecified cataract     Vision loss     Vitamin D deficiency       Past Surgical History:   Procedure Laterality Date    CESAREAN SECTION      HX CATARACT REMOVAL      HX HYSTERECTOMY  1983    HX LAP CHOLECYSTECTOMY      HX PARTIAL HYSTERECTOMY      INCONTINENCE SURGERY  12/09/2021    KNEE SURGERY      Knee replacements    SHOULDER SURGERY        Social History     Tobacco Use    Smoking status: Never     Passive exposure: Never    Smokeless tobacco: Never   Vaping Use     Vaping status: Never Used   Substance Use Topics    Alcohol use: Never    Drug use: Never       Family Medical History:       Problem Relation (Age of Onset)    Anesthesia Complications Mother    Asthma Brother    Cancer Mother    Cervical Cancer Mother    Diabetes Father    Hypertension (High Blood Pressure) Father    Lung disease Father           Outpatient Medications Marked as Taking for the 09/27/23 encounter (Office Visit) with [name], APRN   Medication Sig    celecoxib (CELEBREX) 200 mg Oral Capsule Take 1 Capsule (200 mg total) by mouth Once a day    cholecalciferol, vitamin D3, 1,250 mcg (50,000 unit) Oral Capsule SMARTSIG:1 CAPLET BY MOUTH ONCE A WEEK    ciprofloxacin HCl (CIPRO) 500 mg Oral Tablet Take 1 Tablet (500 mg total) by mouth Every 12 hours    glucagon (GLUCAGON EMERGENCY KIT, HUMAN,) 1 mg Injection Recon Soln To use prn severe hypoglycemia (Patient taking differently: Inject 1 mg under the skin One time To use prn severe hypoglycemia)    insulin degludec 200 unit/mL (3 mL) Subcutaneous Insulin Pen Inject 34 units once daily, adjust as directed, MDD 60    insulin lispro (HUMALOG KWIKPEN INSULIN) 100 unit/mL Subcutaneous Insulin Pen Inject 20 units with bunch and 20 units dinner plus correction 2 units for every 50 greater than 150 with meals, MDD 50    ketoconazole (NIZORAL) 2 % Cream Apply topically Twice daily    losartan (COZAAR) 50 mg Oral Tablet Take 2 Tablets (100 mg total) by mouth Once a day    magnesium oxide (MAG-OX) 400 mg Oral Tablet Take 1 Tablet (400 mg total) by mouth Once a day    metroNIDAZOLE (FLAGYL) 500 mg Oral Tablet Take 1 Tablet (500 mg total) by mouth Every 8 hours    ondansetron (ZOFRAN) 4 mg Oral Tablet Take 1 Tablet (4 mg total) by mouth Three times a day as needed    Pen Needle, Disposable, 32 gauge x 5/32" Needle To use 4 times daily with insulin    potassium chloride (MICRO K) 10 mEq Oral Capsule, Sustained Release Take 1 Capsule (10 mEq total) by mouth Twice  daily    sertraline (ZOLOFT) 100 mg Oral Tablet Take 1 Tablet (100 mg total) by mouth Once a day      Allergies   Allergen Reactions    Penicillins Anaphylaxis    Dulaglutide Rash    Insulin Glargine Rash and Hives/ Urticaria    Lisinopril  Other  Adverse Reaction (Add comment)     Constant cough    Statins-Hmg-Coa Reductase Inhibitors Diarrhea, Nausea/ Vomiting and Myalgia     Duricef(Cefadroxil), Trulicity Solution Pen Injector.        Review of Systems:  Review of Systems   Constitutional:  Negative for malaise/fatigue.   Eyes:  Negative for blurred vision.   Respiratory:  Negative for shortness of breath.    Cardiovascular:  Negative for chest pain and leg swelling.   Gastrointestinal:  Negative for abdominal pain, diarrhea, nausea and vomiting.   Genitourinary:  Negative for frequency.   Endo/Heme/Allergies:  Negative for polydipsia.          BP 120/82   Pulse 68   Resp 17   Ht 1.549 m (5' 0.98")   Wt 94.8 kg (208 lb 14.4 oz)   SpO2 95%   BMI 39.50 kg/m       Physical Exam  Vitals reviewed.   Constitutional:       Appearance: Normal appearance. She is obese.   HENT:      Head: Normocephalic.   Cardiovascular:      Rate and Rhythm: Normal rate and regular rhythm.   Pulmonary:      Effort: Pulmonary effort is normal.      Breath sounds: Normal breath sounds.   Musculoskeletal:      Cervical back: Neck supple.   Skin:     General: Skin is warm and dry.   Neurological:      General: No focal deficit present.      Mental Status: She is alert and oriented to person, place, and time.            Data Reviewed:    A1C: 7.5  A1C Date: 09/21/2023  COMPREHENSIVE METABOLIC PANEL   Lab Results   Component Value Date    SODIUM 142 09/21/2023    POTASSIUM 3.0 (L) 09/21/2023    CHLORIDE 103 09/21/2023    CO2 28 09/21/2023    ANIONGAP 11 09/21/2023    BUN 10 09/21/2023    CREATININE 0.76 09/21/2023    CALCIUM 9.3 09/21/2023    ALBUMIN 3.5 06/14/2023    TOTALPROTEIN 6.9 06/14/2023    ALKPHOS 85 06/14/2023    AST 18  06/14/2023    ALT <7 06/14/2023         LIPID PROFILE  Lab Results   Component Value Date    CHOLESTEROL 212 (H) 06/14/2023    HDLCHOL 46 (L) 06/14/2023    LDLCHOL 145 (H) 06/14/2023    TRIG 117 06/14/2023       THYROID STIMULATING HORMONE  Lab Results   Component Value Date    TSH 2.337 02/14/2023        Urine Microalb/cr ratio   Lab Results   Component Value Date/Time    MICALBCRERAT 26.1 06/14/2023 08:52 AM                 Outside Lab Results 09/23/21:  HgbA1C; 8.4  GFR: 100  GFR: 0.5  Albumin/creatinine: 56.3        Assessment/Plan:  Assessment/Plan   1. Type 2 diabetes mellitus with hyperglycemia, with long-term current use of insulin (CMS HCC)    2. Mixed hyperlipidemia    3. Essential hypertension    4. Hypokalemia           ICD-10-CM    1. Type 2 diabetes mellitus with hyperglycemia, with long-term current use of insulin (CMS HCC)  E11.65 insulin lispro (HUMALOG  KWIKPEN INSULIN) 100 unit/mL Subcutaneous Insulin Pen    Z79.4 insulin degludec 200 unit/mL (3 mL) Subcutaneous Insulin Pen     COMPREHENSIVE METABOLIC PANEL, NON-FASTING     HGA1C (HEMOGLOBIN A1C WITH EST AVG GLUCOSE)     LIPID PANEL      2. Mixed hyperlipidemia  E78.2       3. Essential hypertension  I10       4. Hypokalemia  E87.6 potassium chloride (MICRO K) 10 mEq Oral Capsule, Sustained Release           Orders Placed This Encounter    COMPREHENSIVE METABOLIC PANEL, NON-FASTING    HGA1C (HEMOGLOBIN A1C WITH EST AVG GLUCOSE)    LIPID PANEL    insulin lispro (HUMALOG KWIKPEN INSULIN) 100 unit/mL Subcutaneous Insulin Pen    insulin degludec 200 unit/mL (3 mL) Subcutaneous Insulin Pen    potassium chloride (MICRO K) 10 mEq Oral Capsule, Sustained Release      Return in about 3 months (around 12/26/2023).    Type 2 diabetes - reviewed Dexcom download with this patient and daughter.  Increase Tresiba 34 units once daily.  Increase Humalog to 20 units with brunch and 20 units with supper plus correction scale.  Hypertension - at goal continue management  per PCP  Hyperlipidemia - LDL not at goal,  intolerant to statins d/t myalgias  Neuropathy - reviewed importance of foot care and proper shoes, follows with podiatry regularly  Obesity - to continue with weight loss    Microabuminuria-  Improved and within goal.  Still needs tighter glucose control.  Continue Losartan    Hypokalemia-on recent labs.  Prescribed potassium 10 mEq b.i.d. for 7 days.  Has appointment with PCP next week, to discuss with PCP that time.      On the day of the encounter, a total of  30 minutes was spent on this patient encounter including review of historical information, examination, documentation and post-visit activities.      This note may have been partially generated using MModal Fluency Direct system, and there may be some incorrect words, spellings, and punctuation that were not noted in checking the note before saving.    [name], APRN

## 2023-09-27 NOTE — Patient Instructions (Addendum)
-  Increase Tresiba to 34 units once daily  -Increase Humalog to 20 units with brunch and dinner plus correction scale  -Take potassium 10 meq twice daily for 1 week and follow up with family provider  -Continue Dexcom use  -Follow up with Lowella Bandy in 3 months  -Call for any questions or concerns  -Call for 2 or more blood sugars less than 80 in one week, or any noted pattern of high or low blood sugars        Hypoglycemia (Low Blood Sugar/ Under 80)  How to avoid hypoglycemia:  1. Eat meals and snacks on time  2. Check blood sugar regularly  3. Be mindful of activity and ALWAYS check blood sugar before activity/exercise and before driving  4. ALWAYS have a fast acting source of glucose with you at all times (glucose tablets, glucose gel, smarties, hard candy, or juice box)  TREATMENT  1. Test Blood Sugar at the fingertip - if unable to test blood sugar - treat like it is low  2. Treatment - Rule of 15 eat or drink 15 grams of fast acting carbs (example: 1/2 cup of juice or regular soda, 1 cup low fat or skim milk, 4 glucose tablets, 6 lifesavers)  *If unable to eat use glucagon injection AND call 911  3. Recheck blood sugar in 15 minutes  If blood sugar is less than 80- repeat rule of 15 (as above)  If blood sugar is greater than 80 - be mindful, continue checking blood sugar

## 2023-09-28 ENCOUNTER — Ambulatory Visit (RURAL_HEALTH_CENTER): Payer: Self-pay | Admitting: NURSE PRACTITIONER

## 2023-12-03 ENCOUNTER — Other Ambulatory Visit: Payer: Self-pay

## 2023-12-03 ENCOUNTER — Encounter (INDEPENDENT_AMBULATORY_CARE_PROVIDER_SITE_OTHER): Payer: Self-pay | Admitting: NURSE PRACTITIONER

## 2023-12-03 ENCOUNTER — Ambulatory Visit: Payer: Self-pay | Attending: NURSE PRACTITIONER | Admitting: NURSE PRACTITIONER

## 2023-12-03 VITALS — BP 120/62 | HR 64 | Resp 17 | Ht 60.98 in | Wt 206.7 lb

## 2023-12-03 DIAGNOSIS — E1140 Type 2 diabetes mellitus with diabetic neuropathy, unspecified: Secondary | ICD-10-CM | POA: Insufficient documentation

## 2023-12-03 DIAGNOSIS — Z6839 Body mass index (BMI) 39.0-39.9, adult: Secondary | ICD-10-CM | POA: Insufficient documentation

## 2023-12-03 DIAGNOSIS — I10 Essential (primary) hypertension: Secondary | ICD-10-CM | POA: Insufficient documentation

## 2023-12-03 DIAGNOSIS — E669 Obesity, unspecified: Secondary | ICD-10-CM | POA: Insufficient documentation

## 2023-12-03 DIAGNOSIS — Z79899 Other long term (current) drug therapy: Secondary | ICD-10-CM | POA: Insufficient documentation

## 2023-12-03 DIAGNOSIS — Z794 Long term (current) use of insulin: Secondary | ICD-10-CM | POA: Insufficient documentation

## 2023-12-03 DIAGNOSIS — E782 Mixed hyperlipidemia: Secondary | ICD-10-CM | POA: Insufficient documentation

## 2023-12-03 DIAGNOSIS — E1165 Type 2 diabetes mellitus with hyperglycemia: Secondary | ICD-10-CM | POA: Insufficient documentation

## 2023-12-03 MED ORDER — INSULIN LISPRO (U-100) 100 UNIT/ML SUBCUTANEOUS PEN
PEN_INJECTOR | SUBCUTANEOUS | 3 refills | Status: DC
Start: 2023-12-03 — End: 2024-03-05

## 2023-12-03 MED ORDER — INSULIN LISPRO (U-100) 100 UNIT/ML SUBCUTANEOUS PEN
PEN_INJECTOR | SUBCUTANEOUS | 5 refills | Status: DC
Start: 2023-12-03 — End: 2023-12-03

## 2023-12-03 NOTE — Patient Instructions (Addendum)
-  Continue Tresiba 34 units once daily  -Increase Humalog to 22 units with brunch and supper plus correction scale  -Continue Dexcom  -Follow up with Lowella Bandy in 3 months  -Call for any questions or concerns  -Call for 2 or more blood sugars less than 80 in one week, or any noted pattern of high or low blood sugars    Hypoglycemia (Low Blood Sugar/ Under 80)  How to avoid hypoglycemia:  1. Eat meals and snacks on time  2. Check blood sugar regularly  3. Be mindful of activity and ALWAYS check blood sugar before activity/exercise and before driving  4. ALWAYS have a fast acting source of glucose with you at all times (glucose tablets, glucose gel, smarties, hard candy, or juice box)  TREATMENT  1. Test Blood Sugar at the fingertip - if unable to test blood sugar - treat like it is low  2. Treatment - Rule of 15 eat or drink 15 grams of fast acting carbs (example: 1/2 cup of juice or regular soda, 1 cup low fat or skim milk, 4 glucose tablets, 6 lifesavers)  *If unable to eat use glucagon injection AND call 911  3. Recheck blood sugar in 15 minutes  If blood sugar is less than 80- repeat rule of 15 (as above)  If blood sugar is greater than 80 - be mindful, continue checking blood sugar

## 2023-12-03 NOTE — Progress Notes (Signed)
 Endocrinology, [facility]  [address]  [place] [address]  [phone]    Progress Note    Date of Service:  12/03/2023  Jeanne, [name], 73 y.o. female  Date of Birth:  Nov 10, 1950  PCP: Vertell Novak, NP     HPI:  Jeanne [name] presents to Endocrinology clinic for a follow-up for Type 2 diabetes complicated by other comorbid conditions including: hypertension, hyperlipidemia and obesity. Diagnosed with diabetes in 1997, at age 80. Family history is Positive for Type 2 diabetes- father.  Most recent A1c is 7.5 - indicative of suboptimal glycemic control.  Review of Dexcom download for the last 2 weeks reveals the following:  Average glucose:  185  GMI:  7.7%  In range:  52%  High: 40%  Very high: 8%  Low: 0%  Very low: 0%  Current diabetes medications include:  Tresiba 34 units once daily; Humalog 20 units with brunch and 20 units with dinner plus correction 2:50> 150  Does not  miss medications doses.  Denies difficulty affording medication and/or test strips.   Previous diabetes medication include: Metformin (nausea, diarrhea- even with extended release even with lower doses); Trulicity (nausea); Lantus (rash): Glipizide; Humalog 75/25  Has glucagon at home: Yes  Diabetes Education in the past: No RD in the past - No  Lives with: family  Weight has: decreased  Any hospitalizations r/t to diabetes over the last year: No  Chronic Complications:  Microvascular - no known history of retinopathy, nephropathy, positive for neuropathy   Macrovascular- positive hx of MI, negative for CVA and TIA  Autonomic Dysfunction - denies gastroparesis, or bowel/bladder dysfunction  Denies foot ulcers, deformities. Does perform foot care at home - follows with podiatry in [place] every 3 months  Experiences hypoglycemia: Yes. Symptoms of hypoglycemia present during an event: lethargy, sweats and shakes Experiences hypoglycemia: rarely  Up to date with opthamology exam: Yes, 09/2023, Dr.  Marinda Elk, [place], [place]  Steroids recently: No  History of frequent UTI: No  History of frequent genital yeast infection: No  History of pancreatitis: No  Personal or family Multiple Endocrine Neoplasia Type 2 (MEN2): No     06/19/23: Glycemic control improved.  Unfortunately, [name] did not start the Ozempic.  After talking with family members she was even more scared of side effects.  She has been taking [place] 34 units and not 32 units as prescribed.  Occasional fasting hypoglycemia.    09/27/23: Glycemic control has continued to improve per last labs.  Pt currently being treated for a diverticulitis flare and daughter reports she has not been eating as much and because she has not been eating as much sometimes she skips her prandial insulin and fear of hypoglycemia.  12/03/23: Glycemic control improving per CGM download.  She is having some postprandial hyperglycemia.  I have personally reviewed allergies, chief complaint, medications, past medical history, surgical history, and family history.  I have reviewed referral notes, PCP progress notes and labs relevant to this visit.    Past Medical History:   Diagnosis Date    Clouded crystalline lens     Contact dermatitis and eczema     Depressive disorder     Diverticulitis     Generalized anxiety disorder     High cholesterol     HTN (hypertension)     Hyperglycemia     Hypomagnesemia     IBS (irritable bowel syndrome)     Mixed hyperlipidemia     Premature atrial  contraction     T2DM (type 2 diabetes mellitus) (CMS HCC)     Unspecified cataract     Vision loss     Vitamin D deficiency       Past Surgical History:   Procedure Laterality Date    CESAREAN SECTION      HX CATARACT REMOVAL      HX HYSTERECTOMY  1983    HX LAP CHOLECYSTECTOMY      HX PARTIAL HYSTERECTOMY      INCONTINENCE SURGERY  12/09/2021    KNEE SURGERY      Knee replacements    SHOULDER SURGERY        Social History     Tobacco Use    Smoking status: Never     Passive exposure: Never    Smokeless  tobacco: Never   Vaping Use    Vaping status: Never Used   Substance Use Topics    Alcohol use: Never    Drug use: Never       Family Medical History:       Problem Relation (Age of Onset)    Anesthesia Complications Mother    Asthma Brother    Cancer Mother    Cervical Cancer Mother    Diabetes Father    Hypertension (High Blood Pressure) Father    Lung disease Father           Outpatient Medications Marked as Taking for the 12/03/23 encounter (Office Visit) with [name], APRN   Medication Sig    atorvastatin calcium (ATORVASTATIN ORAL) Take by mouth    celecoxib (CELEBREX) 200 mg Oral Capsule Take 1 Capsule (200 mg total) by mouth Once a day    cholecalciferol, vitamin D3, 1,250 mcg (50,000 unit) Oral Capsule SMARTSIG:1 CAPLET BY MOUTH ONCE A WEEK    ciprofloxacin HCl (CIPRO) 500 mg Oral Tablet Take 1 Tablet (500 mg total) by mouth Every 12 hours    clopidogreL (PLAVIX) 75 mg Oral Tablet Take 1 Tablet (75 mg total) by mouth Once a day    clotrimazole-betamethasone (LOTRISONE) 1-0.05 % Cream Apply topically Twice daily    glucagon (GLUCAGON EMERGENCY KIT, HUMAN,) 1 mg Injection Recon Soln To use prn severe hypoglycemia (Patient taking differently: Inject 1 mg under the skin One time To use prn severe hypoglycemia)    insulin degludec 200 unit/mL (3 mL) Subcutaneous Insulin Pen Inject 34 units once daily, adjust as directed, MDD 60    insulin lispro (HUMALOG KWIKPEN INSULIN) 100 unit/mL Subcutaneous Insulin Pen Inject 22 units with bunch and 22 units dinner plus correction 2 units for every 50 greater than 150 with meals, MDD 50    ketoconazole (NIZORAL) 2 % Cream Apply topically Twice daily    losartan (COZAAR) 50 mg Oral Tablet Take 0.5 Tablets (25 mg total) by mouth Once a day    magnesium oxide (MAG-OX) 400 mg Oral Tablet Take 1 Tablet (400 mg total) by mouth Once a day    metroNIDAZOLE (FLAGYL) 500 mg Oral Tablet Take 1 Tablet (500 mg total) by mouth Every 8 hours    ondansetron (ZOFRAN) 4 mg Oral Tablet  Take 1 Tablet (4 mg total) by mouth Three times a day as needed    Pen Needle, Disposable, 32 gauge x 5/32" Needle To use 4 times daily with insulin    potassium chloride (MICRO K) 10 mEq Oral Capsule, Sustained Release Take 1 Capsule (10 mEq total) by mouth Twice daily    sertraline (ZOLOFT) 100 mg Oral  Tablet Take 1 Tablet (100 mg total) by mouth Once a day    torsemide (DEMADEX) 10 mg Oral Tablet Take 1 Tablet (10 mg total) by mouth Once a day      Allergies   Allergen Reactions    Penicillins Anaphylaxis    Dulaglutide Rash    Insulin Glargine Rash and Hives/ Urticaria    Lisinopril  Other Adverse Reaction (Add comment)     Constant cough    Statins-Hmg-Coa Reductase Inhibitors Diarrhea, Nausea/ Vomiting and Myalgia     Duricef(Cefadroxil), Trulicity Solution Pen Injector.        Review of Systems:  Review of Systems   Constitutional:  Negative for malaise/fatigue.   Eyes:  Negative for blurred vision.   Respiratory:  Negative for shortness of breath.    Cardiovascular:  Negative for chest pain and leg swelling.   Gastrointestinal:  Negative for abdominal pain, diarrhea, nausea and vomiting.   Genitourinary:  Negative for frequency.   Endo/Heme/Allergies:  Negative for polydipsia.          BP 120/62   Pulse 64   Resp 17   Ht 1.549 m (5' 0.98")   Wt 93.8 kg (206 lb 11.2 oz)   SpO2 94%   BMI 39.08 kg/m       Physical Exam  Vitals reviewed.   Constitutional:       Appearance: Normal appearance. She is obese.   HENT:      Head: Normocephalic.   Cardiovascular:      Rate and Rhythm: Normal rate and regular rhythm.   Pulmonary:      Effort: Pulmonary effort is normal.      Breath sounds: Normal breath sounds.   Musculoskeletal:      Cervical back: Neck supple.   Skin:     General: Skin is warm and dry.   Neurological:      General: No focal deficit present.      Mental Status: She is alert and oriented to person, place, and time.            Data Reviewed:    A1C: 7.5  A1C Date: 09/21/2023  COMPREHENSIVE  METABOLIC PANEL   Lab Results   Component Value Date    SODIUM 142 09/21/2023    POTASSIUM 3.0 (L) 09/21/2023    CHLORIDE 103 09/21/2023    CO2 28 09/21/2023    ANIONGAP 11 09/21/2023    BUN 10 09/21/2023    CREATININE 0.76 09/21/2023    CALCIUM 9.3 09/21/2023    ALBUMIN 3.5 06/14/2023    TOTALPROTEIN 6.9 06/14/2023    ALKPHOS 85 06/14/2023    AST 18 06/14/2023    ALT <7 06/14/2023         LIPID PROFILE  Lab Results   Component Value Date    CHOLESTEROL 212 (H) 06/14/2023    HDLCHOL 46 (L) 06/14/2023    LDLCHOL 145 (H) 06/14/2023    TRIG 117 06/14/2023       THYROID STIMULATING HORMONE  Lab Results   Component Value Date    TSH 2.337 02/14/2023        Urine Microalb/cr ratio   Lab Results   Component Value Date/Time    MICALBCRERAT 26.1 06/14/2023 08:52 AM                 Outside Lab Results 09/23/21:  HgbA1C; 8.4  GFR: 100  GFR: 0.5  Albumin/creatinine: 56.3        Assessment/Plan:  Assessment/Plan  1. Type 2 diabetes mellitus with hyperglycemia, with long-term current use of insulin (CMS HCC)    2. Mixed hyperlipidemia    3. Essential hypertension           ICD-10-CM    1. Type 2 diabetes mellitus with hyperglycemia, with long-term current use of insulin (CMS HCC)  E11.65 COMPREHENSIVE METABOLIC PANEL, NON-FASTING    Z79.4 HGA1C (HEMOGLOBIN A1C WITH EST AVG GLUCOSE)     LIPID PANEL     insulin lispro (HUMALOG KWIKPEN INSULIN) 100 unit/mL Subcutaneous Insulin Pen     DISCONTINUED: insulin lispro (HUMALOG KWIKPEN INSULIN) 100 unit/mL Subcutaneous Insulin Pen      2. Mixed hyperlipidemia  E78.2       3. Essential hypertension  I10            Orders Placed This Encounter    COMPREHENSIVE METABOLIC PANEL, NON-FASTING    HGA1C (HEMOGLOBIN A1C WITH EST AVG GLUCOSE)    LIPID PANEL    insulin lispro (HUMALOG KWIKPEN INSULIN) 100 unit/mL Subcutaneous Insulin Pen      Return in about 3 months (around 03/04/2024).    Type 2 diabetes - reviewed Dexcom download with this patient and daughter. Continue Tresiba 34 units once  daily.  Increase Humalog to 22 units with brunch and supper and continue correction scale.  Hypertension - at goal continue management per PCP  Hyperlipidemia - LDL not at goal,  intolerant to statins d/t myalgias  Neuropathy - reviewed importance of foot care and proper shoes, follows with podiatry regularly  Obesity - to continue with her weight loss    Microabuminuria-  Improved and within goal.  Still needs tighter glucose control.  Continue Losartan          On the day of the encounter, a total of  30 minutes was spent on this patient encounter including review of historical information, examination, documentation and post-visit activities.      This note may have been partially generated using MModal Fluency Direct system, and there may be some incorrect words, spellings, and punctuation that were not noted in checking the note before saving.    [name], APRN

## 2023-12-04 ENCOUNTER — Other Ambulatory Visit (INDEPENDENT_AMBULATORY_CARE_PROVIDER_SITE_OTHER): Payer: Self-pay | Admitting: NURSE PRACTITIONER

## 2023-12-04 DIAGNOSIS — Z794 Long term (current) use of insulin: Secondary | ICD-10-CM

## 2023-12-04 MED ORDER — LANCETS 26 GAUGE
4 refills | Status: DC
Start: 2023-12-04 — End: 2023-12-04

## 2023-12-04 MED ORDER — BLOOD-GLUCOSE METER
0 refills | Status: DC
Start: 2023-12-04 — End: 2023-12-04

## 2023-12-04 MED ORDER — LANCETS 26 GAUGE
4 refills | Status: AC
Start: 2023-12-04 — End: ?

## 2023-12-04 MED ORDER — BLOOD-GLUCOSE METER
0 refills | Status: AC
Start: 2023-12-04 — End: 2023-12-05

## 2023-12-04 MED ORDER — BLOOD GLUCOSE TEST STRIPS
ORAL_STRIP | 1 refills | Status: DC
Start: 2023-12-04 — End: 2023-12-04

## 2023-12-04 MED ORDER — BLOOD GLUCOSE TEST STRIPS
ORAL_STRIP | 1 refills | Status: AC
Start: 2023-12-04 — End: ?

## 2023-12-04 NOTE — Telephone Encounter (Signed)
 Patient's daughter called she went over all of patient's medication.  She also would like to see if you would send in for a glucometer for when her CGM malfunctions.  Nmt, lpn

## 2024-03-03 ENCOUNTER — Other Ambulatory Visit: Payer: Self-pay

## 2024-03-03 ENCOUNTER — Ambulatory Visit: Attending: NURSE PRACTITIONER

## 2024-03-03 DIAGNOSIS — Z794 Long term (current) use of insulin: Secondary | ICD-10-CM | POA: Insufficient documentation

## 2024-03-03 DIAGNOSIS — E1165 Type 2 diabetes mellitus with hyperglycemia: Secondary | ICD-10-CM | POA: Insufficient documentation

## 2024-03-03 LAB — COMPREHENSIVE METABOLIC PANEL, NON-FASTING
ALBUMIN: 3.7 g/dL (ref 3.4–4.8)
ALKALINE PHOSPHATASE: 84 U/L (ref 55–145)
ALT (SGPT): 9 U/L (ref <31)
ANION GAP: 9 mmol/L (ref 4–13)
AST (SGOT): 18 U/L (ref 11–34)
BILIRUBIN TOTAL: 0.6 mg/dL (ref 0.3–1.3)
BUN/CREA RATIO: 15 (ref 6–22)
BUN: 10 mg/dL (ref 8–25)
CALCIUM: 9.2 mg/dL (ref 8.6–10.3)
CHLORIDE: 106 mmol/L (ref 96–111)
CO2 TOTAL: 28 mmol/L (ref 23–31)
CREATININE: 0.66 mg/dL (ref 0.60–1.05)
ESTIMATED GFR - FEMALE: >90 mL/min/BSA (ref >=60)
GLUCOSE: 136 mg/dL — ABNORMAL HIGH (ref 65–125)
POTASSIUM: 4.4 mmol/L (ref 3.5–5.1)
PROTEIN TOTAL: 6.7 g/dL (ref 6.0–8.0)
SODIUM: 143 mmol/L (ref 136–145)

## 2024-03-03 LAB — LIPID PANEL
CHOL/HDL RATIO: 4.4
CHOLESTEROL: 199 mg/dL (ref 100–200)
HDL CHOL: 45 mg/dL — ABNORMAL LOW (ref >=50)
LDL CALC: 136 mg/dL — ABNORMAL HIGH (ref <100)
NON-HDL: 154 mg/dL (ref <=190)
TRIGLYCERIDES: 100 mg/dL (ref <150)
VLDL CALC: 18 mg/dL (ref <30)

## 2024-03-03 LAB — HGA1C (HEMOGLOBIN A1C WITH EST AVG GLUCOSE)
ESTIMATED AVERAGE GLUCOSE: 160 mg/dL
HEMOGLOBIN A1C: 7.2 % — ABNORMAL HIGH (ref 4.0–6.0)

## 2024-03-05 ENCOUNTER — Other Ambulatory Visit: Payer: Self-pay

## 2024-03-05 ENCOUNTER — Ambulatory Visit: Payer: Self-pay | Attending: NURSE PRACTITIONER | Admitting: NURSE PRACTITIONER

## 2024-03-05 ENCOUNTER — Encounter (INDEPENDENT_AMBULATORY_CARE_PROVIDER_SITE_OTHER): Payer: Self-pay | Admitting: NURSE PRACTITIONER

## 2024-03-05 VITALS — BP 140/70 | HR 58 | Resp 17 | Ht 60.98 in | Wt 213.0 lb

## 2024-03-05 DIAGNOSIS — Z833 Family history of diabetes mellitus: Secondary | ICD-10-CM | POA: Insufficient documentation

## 2024-03-05 DIAGNOSIS — I10 Essential (primary) hypertension: Secondary | ICD-10-CM | POA: Insufficient documentation

## 2024-03-05 DIAGNOSIS — E782 Mixed hyperlipidemia: Secondary | ICD-10-CM | POA: Insufficient documentation

## 2024-03-05 DIAGNOSIS — E1140 Type 2 diabetes mellitus with diabetic neuropathy, unspecified: Secondary | ICD-10-CM | POA: Insufficient documentation

## 2024-03-05 DIAGNOSIS — E1165 Type 2 diabetes mellitus with hyperglycemia: Secondary | ICD-10-CM | POA: Insufficient documentation

## 2024-03-05 DIAGNOSIS — Z79899 Other long term (current) drug therapy: Secondary | ICD-10-CM | POA: Insufficient documentation

## 2024-03-05 DIAGNOSIS — Z794 Long term (current) use of insulin: Secondary | ICD-10-CM | POA: Insufficient documentation

## 2024-03-05 DIAGNOSIS — Z6841 Body Mass Index (BMI) 40.0 and over, adult: Secondary | ICD-10-CM | POA: Insufficient documentation

## 2024-03-05 DIAGNOSIS — E669 Obesity, unspecified: Secondary | ICD-10-CM | POA: Insufficient documentation

## 2024-03-05 MED ORDER — INSULIN DEGLUDEC (U-200) 200 UNIT/ML (3 ML) SUBCUTANEOUS PEN
PEN_INJECTOR | SUBCUTANEOUS | 5 refills | Status: DC
Start: 2024-03-05 — End: 2024-04-30

## 2024-03-05 MED ORDER — INSULIN LISPRO (U-100) 100 UNIT/ML SUBCUTANEOUS PEN
PEN_INJECTOR | SUBCUTANEOUS | 3 refills | Status: DC
Start: 2024-03-05 — End: 2024-04-30

## 2024-03-05 NOTE — Progress Notes (Signed)
 Endocrinology, [facility]  [address]  [place] [address]  [phone]    Progress Note    Date of Service:  03/05/2024  Jeanne [name], Jeanne [name], 73 y.o. female  Date of Birth:  12-Apr-1951  PCP: Prazak-Cooper Lorene, NP     HPI:  Jeanne [name] presents to Endocrinology clinic for a follow-up for Type 2 diabetes complicated by other comorbid conditions including: hypertension, hyperlipidemia and obesity. Diagnosed with diabetes in 1997, at age 39. Family history is Positive for Type 2 diabetes- father.  Most recent A1c is 7.2 - indicative of suboptimal glycemic control.  Review of Dexcom download for the last 2 weeks reveals the following:  Average glucose:  180  GMI:  7.6%  In range:  53%  High: 38%  Very high: 9%  Low: 0%  Very low: 0%  Current diabetes medications include:  Tresiba  34 units once daily; Humalog  22 units with brunch and 22 units with dinner plus correction 2:50> 150  Does not  miss medications doses.  Denies difficulty affording medication and/or test strips.   Previous diabetes medication include: Metformin (nausea, diarrhea- even with extended release even with lower doses); Trulicity (nausea); Lantus (rash): Glipizide; Humalog  75/25  Has glucagon  at home: Yes  Diabetes Education in the past: No RD in the past - No  Lives with: family  Weight has: increased  Any hospitalizations r/t to diabetes over the last year: No  Chronic Complications:  Microvascular - no known history of retinopathy, nephropathy, positive for neuropathy   Macrovascular- positive hx of MI, negative for CVA and TIA  Autonomic Dysfunction - denies gastroparesis, or bowel/bladder dysfunction  Denies foot ulcers, deformities. Does perform foot care at home - follows with podiatry in [place] every 3 months  Experiences hypoglycemia: Yes. Symptoms of hypoglycemia present during an event: lethargy, sweats and shakes Experiences hypoglycemia: rarely  Up to date with opthamology exam: Yes, 09/2023, Dr.  Olene Berne, [place], [place]  Steroids recently: No  History of frequent UTI: No  History of frequent genital yeast infection: No  History of pancreatitis: No  Personal or family Multiple Endocrine Neoplasia Type 2 (MEN2): No     06/19/23: Glycemic control improved.  Unfortunately, [name] did not start the Ozempic .  After talking with family members she was even more scared of side effects.  She has been taking Tresiba  34 units and not 32 units as prescribed.  Occasional fasting hypoglycemia.    09/27/23: Glycemic control has continued to improve per last labs.  Pt currently being treated for a diverticulitis flare and daughter reports she has not been eating as much and because she has not been eating as much sometimes she skips her prandial insulin  and fear of hypoglycemia.  12/03/23: Glycemic control improving per CGM download.  She is having some postprandial hyperglycemia.  03/05/24: Glycemic control improved. Continues to have some postprandial hyperglycemia, more with dinner.  I have personally reviewed allergies, chief complaint, medications, past medical history, surgical history, and family history.  I have reviewed referral notes, PCP progress notes and labs relevant to this visit.    Past Medical History:   Diagnosis Date    Clouded crystalline lens     Contact dermatitis and eczema     Depressive disorder     Diverticulitis     Generalized anxiety disorder     High cholesterol     HTN (hypertension)     Hyperglycemia     Hypomagnesemia     IBS (irritable  bowel syndrome)     Mixed hyperlipidemia     Premature atrial contraction     T2DM (type 2 diabetes mellitus)     Unspecified cataract     Vision loss     Vitamin D deficiency       Past Surgical History:   Procedure Laterality Date    CESAREAN SECTION      HX CATARACT REMOVAL      HX HYSTERECTOMY  1983    HX LAP CHOLECYSTECTOMY      HX PARTIAL HYSTERECTOMY      INCONTINENCE SURGERY  12/09/2021    KNEE SURGERY      Knee replacements    SHOULDER SURGERY        Social  History     Tobacco Use    Smoking status: Never     Passive exposure: Never    Smokeless tobacco: Never   Vaping Use    Vaping status: Never Used   Substance Use Topics    Alcohol use: Never    Drug use: Never       Family Medical History:       Problem Relation (Age of Onset)    Anesthesia Complications Mother    Asthma Brother    Cancer Mother    Cervical Cancer Mother    Diabetes Father    Hypertension (High Blood Pressure) Father    Lung disease Father           Outpatient Medications Marked as Taking for the 03/05/24 encounter (Office Visit) with Veta Govern, APRN   Medication Sig    amLODIPine (NORVASC) 5 mg Oral Tablet Take 1 Tablet (5 mg total) by mouth Once a day    aspirin (ECOTRIN) 81 mg Oral Tablet, Delayed Release (E.C.) Take 1 Tablet (81 mg total) by mouth    atorvastatin (LIPITOR) 40 mg Oral Tablet Take 1 Tablet (40 mg total) by mouth Every evening    Blood Sugar Diagnostic (BLOOD GLUCOSE TEST) Strip Use to monitor blood sugar four times daily for CGM malfunction    celecoxib (CELEBREX) 200 mg Oral Capsule Take 1 Capsule (200 mg total) by mouth Once a day    cholecalciferol, vitamin D3, 1,250 mcg (50,000 unit) Oral Capsule SMARTSIG:1 CAPLET BY MOUTH ONCE A WEEK    clopidogreL (PLAVIX) 75 mg Oral Tablet Take 1 Tablet (75 mg total) by mouth Once a day    clotrimazole-betamethasone (LOTRISONE) 1-0.05 % Cream Apply topically Twice daily    glucagon  (GLUCAGON  EMERGENCY KIT, HUMAN,) 1 mg Injection Recon Soln To use prn severe hypoglycemia    insulin  degludec 200 unit/mL (3 mL) Subcutaneous Insulin  Pen Inject 34 units once daily, adjust as directed, MDD 60    insulin  lispro (HUMALOG  KWIKPEN INSULIN ) 100 unit/mL Subcutaneous Insulin  Pen Inject 24 units with bunch and 25 units dinner plus correction 2 units for every 50 greater than 150 with meals, MDD 75    isosorbide mononitrate (IMDUR) 30 mg Oral Tablet Sustained Release 24 hr Take 1 Tablet (30 mg total) by mouth Every morning    ketoconazole (NIZORAL) 2  % Cream Apply topically Twice daily    lancets (UNIVERSAL 1 LANCETS) 26 gauge Misc Fingersticks 4 times daily and as needed.    losartan (COZAAR) 50 mg Oral Tablet Take 0.5 Tablets (25 mg total) by mouth Once a day    magnesium oxide (MAG-OX) 400 mg Oral Tablet Take 1 Tablet (400 mg total) by mouth Once a day    metoprolol succinate (TOPROL-XL)  25 mg Oral Tablet Sustained Release 24 hr Take 1 Tablet (25 mg total) by mouth Once a day    metroNIDAZOLE (FLAGYL) 500 mg Oral Tablet Take 1 Tablet (500 mg total) by mouth Every 8 hours    ondansetron (ZOFRAN) 4 mg Oral Tablet Take 1 Tablet (4 mg total) by mouth Three times a day as needed    Pen Needle, Disposable, 32 gauge x 5/32 Needle To use 4 times daily with insulin     potassium chloride  (MICRO K) 10 mEq Oral Capsule, Sustained Release Take 1 Capsule (10 mEq total) by mouth Twice daily    sertraline (ZOLOFT) 100 mg Oral Tablet Take 1 Tablet (100 mg total) by mouth Once a day    torsemide (DEMADEX) 10 mg Oral Tablet Take 1 Tablet (10 mg total) by mouth Once a day      Allergies   Allergen Reactions    Penicillins Anaphylaxis    Dulaglutide Rash    Insulin  Glargine Rash and Hives/ Urticaria    Lisinopril  Other Adverse Reaction (Add comment)     Constant cough    Statins-Hmg-Coa Reductase Inhibitors Diarrhea, Nausea/ Vomiting and Myalgia     Duricef(Cefadroxil), Trulicity Solution Pen Injector.        Review of Systems:  Review of Systems   Constitutional:  Negative for malaise/fatigue.   Eyes:  Negative for blurred vision.   Respiratory:  Negative for shortness of breath.    Cardiovascular:  Negative for chest pain and leg swelling.   Gastrointestinal:  Negative for abdominal pain, diarrhea, nausea and vomiting.   Genitourinary:  Negative for frequency.   Endo/Heme/Allergies:  Negative for polydipsia.          BP (!) 140/70   Pulse 58   Resp 17   Ht 1.549 m (5' 0.98)   Wt 96.6 kg (213 lb)   SpO2 95%   BMI 40.27 kg/m       Physical Exam  Vitals reviewed.    Constitutional:       Appearance: Normal appearance. She is obese.   HENT:      Head: Normocephalic.   Cardiovascular:      Rate and Rhythm: Normal rate and regular rhythm.   Pulmonary:      Effort: Pulmonary effort is normal.      Breath sounds: Normal breath sounds.   Musculoskeletal:      Cervical back: Neck supple.   Skin:     General: Skin is warm and dry.   Neurological:      General: No focal deficit present.      Mental Status: She is alert and oriented to person, place, and time.            Data Reviewed:    A1C: 7.2  A1C Date: 03/03/2024  COMPREHENSIVE METABOLIC PANEL   Lab Results   Component Value Date    SODIUM 143 03/03/2024    POTASSIUM 4.4 03/03/2024    CHLORIDE 106 03/03/2024    CO2 28 03/03/2024    ANIONGAP 9 03/03/2024    BUN 10 03/03/2024    CREATININE 0.66 03/03/2024    CALCIUM 9.2 03/03/2024    ALBUMIN 3.7 03/03/2024    TOTALPROTEIN 6.7 03/03/2024    ALKPHOS 84 03/03/2024    AST 18 03/03/2024    ALT 9 03/03/2024         LIPID PROFILE  Lab Results   Component Value Date    CHOLESTEROL 199 03/03/2024    HDLCHOL 45 (  L) 03/03/2024    LDLCHOL 136 (H) 03/03/2024    TRIG 100 03/03/2024       THYROID  STIMULATING HORMONE  Lab Results   Component Value Date    TSH 2.337 02/14/2023        Urine Microalb/cr ratio   Lab Results   Component Value Date/Time    MICALBCRERAT 26.1 06/14/2023 08:52 AM                 Outside Lab Results 09/23/21:  HgbA1C; 8.4  GFR: 100  GFR: 0.5  Albumin/creatinine: 56.3        Assessment/Plan:  Assessment/Plan   1. Type 2 diabetes mellitus with hyperglycemia, with long-term current use of insulin  (CMS HCC)    2. Mixed hyperlipidemia    3. Essential hypertension           ICD-10-CM    1. Type 2 diabetes mellitus with hyperglycemia, with long-term current use of insulin  (CMS HCC)  E11.65 COMPREHENSIVE METABOLIC PANEL, NON-FASTING    Z79.4 HGA1C (HEMOGLOBIN A1C WITH EST AVG GLUCOSE)     LIPID PANEL     MICROALBUMIN/CREATININE RATIO, URINE, RANDOM     THYROID  STIMULATING HORMONE  WITH FREE T4 REFLEX     insulin  degludec 200 unit/mL (3 mL) Subcutaneous Insulin  Pen     insulin  lispro (HUMALOG  KWIKPEN INSULIN ) 100 unit/mL Subcutaneous Insulin  Pen      2. Mixed hyperlipidemia  E78.2       3. Essential hypertension  I10            Orders Placed This Encounter    COMPREHENSIVE METABOLIC PANEL, NON-FASTING    HGA1C (HEMOGLOBIN A1C WITH EST AVG GLUCOSE)    LIPID PANEL    MICROALBUMIN/CREATININE RATIO, URINE, RANDOM    THYROID  STIMULATING HORMONE WITH FREE T4 REFLEX    insulin  degludec 200 unit/mL (3 mL) Subcutaneous Insulin  Pen    insulin  lispro (HUMALOG  KWIKPEN INSULIN ) 100 unit/mL Subcutaneous Insulin  Pen      Return in about 3 months (around 06/05/2024).    Type 2 diabetes - reviewed Dexcom download and labs with this patient. Continue Tresiba  34 units once daily.  Increase Humalog  to 24 units with brunch and 25 units wtih supper and continue correction scale. To continue Dexcom use.  Hypertension - at goal continue management per PCP  Hyperlipidemia - LDL not at goal,  intolerant to statins d/t myalgias  Neuropathy - reviewed importance of foot care and proper shoes, continue to follow with podiatry regularly  Obesity - has gained weight since last appointment.  Discussed increasing physical activity and being mindful of diet.    Microabuminuria-  Improved and within goal.  Still needs tighter glucose control.  Continue Losartan          This note may have been partially generated using MModal Fluency Direct system, and there may be some incorrect words, spellings, and punctuation that were not noted in checking the note before saving.    Veta Govern, APRN

## 2024-03-05 NOTE — Patient Instructions (Signed)
-  Continue Tresiba  34 units once daily  -Increase Humalog  to 24 units with brunch and 25 units with supper per scale provided  -Continue Dexcom use  -Follow up with Landa Pine in 3 months, with labs before  -Call for any questions or concerns  -Call for 2 or more blood sugars less than 80 in one week, or any noted pattern of high or low blood sugars      Hypoglycemia (Low Blood Sugar/ Under 80)  How to avoid hypoglycemia:  1. Eat meals and snacks on time  2. Check blood sugar regularly  3. Be mindful of activity and ALWAYS check blood sugar before activity/exercise and before driving  4. ALWAYS have a fast acting source of glucose with you at all times (glucose tablets, glucose gel, smarties, hard candy, or juice box)  TREATMENT  1. Test Blood Sugar at the fingertip - if unable to test blood sugar - treat like it is low  2. Treatment - Rule of 15 eat or drink 15 grams of fast acting carbs (example: 1/2 cup of juice or regular soda, 1 cup low fat or skim milk, 4 glucose tablets, 6 lifesavers)  *If unable to eat use glucagon  injection AND call 911  3. Recheck blood sugar in 15 minutes  If blood sugar is less than 80- repeat rule of 15 (as above)  If blood sugar is greater than 80 - be mindful, continue checking blood sugar

## 2024-03-13 ENCOUNTER — Emergency Department: Admission: EM | Admit: 2024-03-13 | Discharge: 2024-03-13 | Disposition: A | Discharge status: Home / Self Care

## 2024-03-13 ENCOUNTER — Other Ambulatory Visit: Payer: Self-pay

## 2024-03-13 ENCOUNTER — Encounter (HOSPITAL_COMMUNITY): Payer: Self-pay

## 2024-03-13 DIAGNOSIS — S61412A Laceration without foreign body of left hand, initial encounter: Secondary | ICD-10-CM | POA: Insufficient documentation

## 2024-03-13 DIAGNOSIS — Y93I9 Activity, other involving external motion: Secondary | ICD-10-CM | POA: Insufficient documentation

## 2024-03-13 DIAGNOSIS — S61419A Laceration without foreign body of unspecified hand, initial encounter: Secondary | ICD-10-CM

## 2024-03-13 DIAGNOSIS — W541XXA Struck by dog, initial encounter: Secondary | ICD-10-CM | POA: Insufficient documentation

## 2024-03-13 MED ORDER — NEOMYCIN-BACITRACN ZN-POLYMYXN 3.5 MG-400 UNIT-5,000 UNIT TOP OINT PKT
1.0000 | TOPICAL_OINTMENT | CUTANEOUS | Status: AC
Start: 2024-03-13 — End: 2024-03-13
  Administered 2024-03-13: 1 via TOPICAL
  Filled 2024-03-13: qty 1

## 2024-03-13 NOTE — ED Nurses Note (Signed)
 Pt provided with AVS and instructed on follow up care and medications. Applied non-stick and wrapped L hand. Pt denies questions. Pt declined wheelchair out of ER. Pt ambulated out of ER in stable condition.
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
## 2024-03-13 NOTE — Discharge Instructions (Signed)
 Monitor for signs and symptoms of infection and return to ED if symptoms worsen.  Keep antibiotic ointment on wound and keep wound clean.  Follow up with PCP for further management.  I have referred you to a new provider to establish care.

## 2024-03-14 ENCOUNTER — Telehealth (HOSPITAL_COMMUNITY): Payer: Self-pay

## 2024-03-14 NOTE — Progress Notes (Signed)
 Post Ed Follow-Up    Post ED Follow-Up:   Document completed and/or attempted interactive contact(s) after transition to home after emergency department stay.:   Transition Facility and relevant Date:   Discharge Date: 03/13/24  Discharge from [facility] Emergency Department?: Yes  Discharge Facility: [facility]  Contacted by: Reena Cleverly RN  Contact method: Patient/Caregiver Telephone, MyChart Patient Portal  Contact first attempt: 03/14/2024 12:58 PM  Contact second attempt: 03/14/2024 12:58 PM  MyChart message sent?: Yes  Follow Up Visit: No answer - left message  Nurse Navigator toll free number, resources available, and hours of operation provided to patient and/or caregiver.  Referral for [facility] is placed and you should receive a call.

## 2024-04-14 ENCOUNTER — Telehealth (INDEPENDENT_AMBULATORY_CARE_PROVIDER_SITE_OTHER): Payer: Self-pay | Admitting: NURSE PRACTITIONER

## 2024-04-14 DIAGNOSIS — E1165 Type 2 diabetes mellitus with hyperglycemia: Secondary | ICD-10-CM

## 2024-04-14 NOTE — Telephone Encounter (Signed)
 Patient's daughter Elray) called and thinks that her receiver for her mother's Dexcom is not reading correctly. They have called Dexcom and Dexcom states that it appears to be working correctly, but they are unsure. I have told her to come pick-up a new receiver and sensor to change to. Darice says that they have not given her mother Tresiba  for several days and cut her Novolog to 10 units prior to eating and her glucose is still dropping into the 40's with FS overnight. Asked her to continue with the stopped Tresiba , to continue with 10 units Novolog prior to meals with 2 u for every 50 greater than 150 and to call back later in the week to let us  know what her glucose levels are running. I have told Darice that we may put her back on the Tresiba  as I expect her glucose readings will start to increase. She confirmed understanding.

## 2024-04-28 NOTE — Telephone Encounter (Signed)
 Spoke with Jeanne [name] and her mother is still not taking Tresiba  but is having glucose readings in the 160 mg/dL range fasting. Asked Jeanne [name] to bring her mother and the Dexcom receiver in to download and discuss changes to medications. Jeanne [name] stated that she would call back to schedule.

## 2024-04-30 ENCOUNTER — Ambulatory Visit: Payer: Self-pay | Attending: INTERNAL MEDICINE-ENDOCRINOLOGY-DIABETES AND METABOLISM

## 2024-04-30 ENCOUNTER — Other Ambulatory Visit: Payer: Self-pay

## 2024-04-30 ENCOUNTER — Other Ambulatory Visit (INDEPENDENT_AMBULATORY_CARE_PROVIDER_SITE_OTHER): Payer: Self-pay | Admitting: NURSE PRACTITIONER

## 2024-04-30 DIAGNOSIS — Z794 Long term (current) use of insulin: Secondary | ICD-10-CM | POA: Insufficient documentation

## 2024-04-30 DIAGNOSIS — E1165 Type 2 diabetes mellitus with hyperglycemia: Secondary | ICD-10-CM

## 2024-04-30 NOTE — Progress Notes (Signed)
 [facility]  [address]  [place] [address]  Operated by [facility]     Name: Jeanne [name] MRN:  [id number]   Date: 04/30/2024 Age: 73 y.o.      DIABETES EDUCATION NOTE    Chief Complaint:  Download Dexcom, adjust medications as needed, diabetes education.      HISTORY:    Past Medical History:   Diagnosis Date    Clouded crystalline lens     Contact dermatitis and eczema     Depressive disorder     Diverticulitis     Generalized anxiety disorder     High cholesterol     HTN (hypertension)     Hyperglycemia     Hypomagnesemia     IBS (irritable bowel syndrome)     Mixed hyperlipidemia     Premature atrial contraction     T2DM (type 2 diabetes mellitus)     Unspecified cataract     Vision loss     Vitamin D deficiency      Current Outpatient Medications   Medication Sig    amLODIPine (NORVASC) 5 mg Oral Tablet Take 1 Tablet (5 mg total) by mouth Once a day    aspirin (ECOTRIN) 81 mg Oral Tablet, Delayed Release (E.C.) Take 1 Tablet (81 mg total) by mouth    atorvastatin (LIPITOR) 40 mg Oral Tablet Take 1 Tablet (40 mg total) by mouth Every evening    Blood Sugar Diagnostic (BLOOD GLUCOSE TEST) Strip Use to monitor blood sugar four times daily for CGM malfunction    celecoxib (CELEBREX) 200 mg Oral Capsule Take 1 Capsule (200 mg total) by mouth Once a day    cholecalciferol, vitamin D3, 1,250 mcg (50,000 unit) Oral Capsule SMARTSIG:1 CAPLET BY MOUTH ONCE A WEEK    clopidogreL (PLAVIX) 75 mg Oral Tablet Take 1 Tablet (75 mg total) by mouth Once a day    clotrimazole-betamethasone (LOTRISONE) 1-0.05 % Cream Apply topically Twice daily    glucagon  (GLUCAGON  EMERGENCY KIT, HUMAN,) 1 mg Injection Recon Soln To use prn severe hypoglycemia    insulin  degludec 200 unit/mL (3 mL) Subcutaneous Insulin  Pen Inject 34 units once daily, adjust as directed, MDD 60    insulin  lispro (HUMALOG  KWIKPEN INSULIN ) 100 unit/mL Subcutaneous Insulin  Pen Inject 24 units  with bunch and 25 units dinner plus correction 2 units for every 50 greater than 150 with meals, MDD 75    isosorbide mononitrate (IMDUR) 30 mg Oral Tablet Sustained Release 24 hr Take 1 Tablet (30 mg total) by mouth Every morning    ketoconazole (NIZORAL) 2 % Cream Apply topically Twice daily    lancets (UNIVERSAL 1 LANCETS) 26 gauge Misc Fingersticks 4 times daily and as needed.    losartan (COZAAR) 50 mg Oral Tablet Take 0.5 Tablets (25 mg total) by mouth Once a day    magnesium oxide (MAG-OX) 400 mg Oral Tablet Take 1 Tablet (400 mg total) by mouth Once a day    metoprolol succinate (TOPROL-XL) 25 mg Oral Tablet Sustained Release 24 hr Take 1 Tablet (25 mg total) by mouth Once a day    metroNIDAZOLE (FLAGYL) 500 mg Oral Tablet Take 1 Tablet (500 mg total) by mouth Every 8 hours    ondansetron (ZOFRAN) 4 mg Oral Tablet Take 1 Tablet (4 mg total) by mouth Three times a day as needed    Pen Needle, Disposable, 32 gauge x 5/32 Needle To use 4 times daily with insulin   potassium chloride  (MICRO K) 10 mEq Oral Capsule, Sustained Release Take 1 Capsule (10 mEq total) by mouth Twice daily    sertraline (ZOLOFT) 100 mg Oral Tablet Take 1 Tablet (100 mg total) by mouth Once a day    torsemide (DEMADEX) 10 mg Oral Tablet Take 1 Tablet (10 mg total) by mouth Once a day     Social History     Substance and Sexual Activity   Alcohol Use Never     Tobacco Use History[1]    VITAL SIGNS:  Most Recent Vitals    Flowsheet Row ED from 03/13/2024 in [facility] -   Emergency Department   Temperature 36.7 C (98 F) filed at... 03/13/2024 1831   Heart Rate 70 filed at... 03/13/2024 1831   Respiratory Rate 18 filed at... 03/13/2024 1831   BP (Non-Invasive) 118/80 filed at... 03/13/2024 1831   SpO2 96 % filed at... 03/13/2024 1831   Height 1.549 m (5' 1) filed at... 03/13/2024 1706   Weight 96.6 kg (213 lb) filed at... 03/13/2024 1706   BMI (Calculated) 40.33 filed at... 03/13/2024 1706   BSA (Calculated)  2.04 filed at... 03/13/2024 1706   Lab Results   Component Value Date    HA1C 7.2 (H) 03/03/2024     PATIENT REPORTS:    Monitoring:   Do you test your blood sugar?  Yes  Number of hypoglycemic episodes in the past 2 weeks?  0  Medications:  Do you take your medicine?  Yes, but not as prescribed  Nutrition:    Do you follow a meal plan to manage diabetes?  No  Meal Planning:   Breakfast: none  Lunch: boiled egg, toast, tomato, water, hot tea, regular soda  Dinner: meat, vegetables, starch  Snack(s): raw vegetables, regular soda  Beverages consumed throughout the day: water, sometimes regular soda-she states that she has cut back considerably  Meal(s) typically skipped throughout the day: breakfast  Activity:  Do you exercise?  No    Patient's daughter with patient today. She lives with and helps to care for her mother. She does not have a regular sleep schedule. She sleeps from about 5 am to 10 am . She will eat her breakfast between noon and 2 pm. She eats dinner with her family. She has a late night snack of raw vegetables from the garden. She had several overnight lows a couple of weeks ago and she stopped taking almost all of her insulin . Her glucose is now running high almost all the time. Per her daughter, she had stents a couple of years ago and is not a candidate for open heart surgery.     ASSESSMENT AND PLAN:     TOPICS DISCUSSED:  Testing blood glucose: times, targets, recording, when to return for review.  Jeanne [name] currently has a glucometer at home in case of Dexcom failure.    A1c: eAG, goal of < 7.3%, last A1c 7.2% 03/03/24  Complications: foot conditions, PAD, dermatologic conditions, ocular conditions, oral conditions, auditory impairment, cardiovascular, nephropathy, neuropathy, sleep apnea, depression, gastroparesis, hepatic conditions (MASH, MASLD). Reviewed care to prevent complications. Discussed that high glucose puts her at risk for developing problems with her stents.   Hyperglycemia:  causes, signs/symptoms, treatment, prevention. Discussed taking medication as prescribed and regularly.   Hypoglycemia: causes, signs/symptoms, treatment, prevention, rule of 15, glucagon /baqsimi . Call if glucose drops below 80 mg/dL more than 3 times in 1 week.   Insulin : types, need for, onset, duration, dose adjustment,  administration times, and when to hold.  Jeanne [name] uses Humalog , Tresiba  insulin .   Sliding Scale for Humalog  before meals:   Glucose Reading           Before       Breakfast  Before       Dinner      Skipped Meal    & Bedtime        Insulin  Units          Below 80      No Insulin , Treat and Recheck Glucose          80-150   4  4  0    151-200   6  6  2     201-250   8  8  4     251-300   10  10  6     301-350   12  12  8     351-400   14  14  10     Over 401   16  16  12     Take 20 units Tresiba  daily   Dietary: carbohydrate containing foods, protein containing foods, balance of macronutrients, healthy meals, healthy snacks, appropriate beverages, portion control. Please balance meals and snacks with protein.     CGM interpretation 04/17/24 - 04/30/24:   Patient is wearing a Dexcom G7 device.  Average sensor glucose is 222 mg/dL .  Variability 49 mg/dL.  GMI:  8.6%.  Time in range:  23%. Time CGM is active: 97%  Hypoglycemia:  0 episodes noted; low 0% of the time; very low 54mg /dl 0%.  Nocturnal hypoglycemia:  0 noted.  Hyperglycemia:  47% greater than 180mg /dl; 69% >250mg /dl.  Patterns noted include:  high post-prandials and then remains elevated until early morning hours  Recommendations as follows:  decrease Tresiba  to 20 units daily, and change Humalog  sliding scale to above  Reviewed CGM trends with patient.  CGM report, including summary, in media.     GOALS  -balance of macronutrients at all meals and snacks (protein and carbs)  -change Humalog  to 4 units prior to 2 meals plus correction of 2 units for every 50 mg/dL over 849 mg/dL, use correction for skipped meals  -Take 20 units Tresiba   daily  -Call if glucose drops below 80 mg/dL more than 3 times in 1 week.   -call in 2 weeks to provide an update on glucose readings  -keep 3 month appt with Levon Connors, APRN    Next scheduled appointment:      Wednesday Jun 04, 2024    Return Patient Visit with Connors Levon, APRN at 10:40 AM      Endocrinology, [facility], [place]  [address]  [place] [address]  [phone]     Elida Rives, RD,LD,CDCES  Total time spent with patient 60 minutes Diabetes Education - [phone] - 2 units    The above note will be routed via EPIC to the referring provider.             [1]   Social History  Tobacco Use   Smoking Status Never    Passive exposure: Never   Smokeless Tobacco Never

## 2024-04-30 NOTE — Telephone Encounter (Signed)
 Changed insulin  to the following:  Sliding Scale for Humalog  before meals:   Glucose Reading           Before       Breakfast  Before       Dinner      Skipped Meal    & Bedtime        Insulin  Units          Below 80      No Insulin , Treat and Recheck Glucose          80-150   4  4  0    151-200   6  6  2     201-250   8  8  4     251-300   10  10  6     301-350   12  12  8     351-400   14  14  10     Over 401   16  16  12       Take 20 units Tresiba  daily   Updated med list.

## 2024-05-01 MED ORDER — INSULIN LISPRO (U-100) 100 UNIT/ML SUBCUTANEOUS PEN
PEN_INJECTOR | SUBCUTANEOUS | Status: DC
Start: 2024-05-01 — End: 2024-06-24

## 2024-05-01 MED ORDER — INSULIN DEGLUDEC (U-200) 200 UNIT/ML (3 ML) SUBCUTANEOUS PEN
PEN_INJECTOR | SUBCUTANEOUS | Status: DC
Start: 2024-05-01 — End: 2024-06-24

## 2024-06-04 ENCOUNTER — Ambulatory Visit (INDEPENDENT_AMBULATORY_CARE_PROVIDER_SITE_OTHER): Payer: Self-pay | Admitting: NURSE PRACTITIONER

## 2024-06-16 ENCOUNTER — Ambulatory Visit: Attending: NURSE PRACTITIONER

## 2024-06-16 ENCOUNTER — Other Ambulatory Visit: Payer: Self-pay

## 2024-06-16 DIAGNOSIS — E1165 Type 2 diabetes mellitus with hyperglycemia: Secondary | ICD-10-CM | POA: Insufficient documentation

## 2024-06-16 DIAGNOSIS — Z794 Long term (current) use of insulin: Secondary | ICD-10-CM | POA: Insufficient documentation

## 2024-06-16 LAB — COMPREHENSIVE METABOLIC PANEL, NON-FASTING
ALBUMIN: 3.6 g/dL (ref 3.4–4.8)
ALKALINE PHOSPHATASE: 82 U/L (ref 55–145)
ALT (SGPT): <7 U/L (ref <31)
ANION GAP: 12 mmol/L (ref 4–13)
AST (SGOT): 19 U/L (ref 11–34)
BILIRUBIN TOTAL: 0.5 mg/dL (ref 0.3–1.3)
BUN/CREA RATIO: 16 (ref 6–22)
BUN: 9 mg/dL (ref 8–25)
CALCIUM: 9 mg/dL (ref 8.6–10.3)
CHLORIDE: 105 mmol/L (ref 96–111)
CO2 TOTAL: 27 mmol/L (ref 23–31)
CREATININE: 0.58 mg/dL — ABNORMAL LOW (ref 0.60–1.05)
GLUCOSE: 161 mg/dL — ABNORMAL HIGH (ref 65–125)
POTASSIUM: 4 mmol/L (ref 3.5–5.1)
PROTEIN TOTAL: 6.6 g/dL (ref 6.0–8.0)
SODIUM: 144 mmol/L (ref 136–145)
eGFRcr - FEMALE: >90 mL/min/1.73mˆ2 (ref >=60)

## 2024-06-16 LAB — HGA1C (HEMOGLOBIN A1C WITH EST AVG GLUCOSE)
ESTIMATED AVERAGE GLUCOSE: 200 mg/dL
HEMOGLOBIN A1C: 8.6 % — ABNORMAL HIGH (ref 4.0–6.0)

## 2024-06-16 LAB — THYROID STIMULATING HORMONE WITH FREE T4 REFLEX: TSH: 2.964 u[IU]/mL (ref 0.350–4.940)

## 2024-06-16 LAB — MICROALBUMIN/CREATININE RATIO, URINE, RANDOM
CREATININE RANDOM URINE: 176.53 mg/dL
MICROALBUMIN RANDOM URINE: 4.3 mg/dL
MICROALBUMIN/CREATININE RATIO RANDOM URINE: 24.4 mg/g (ref <30.0)

## 2024-06-16 LAB — LIPID PANEL
CHOL/HDL RATIO: 4.4
CHOLESTEROL: 200 mg/dL (ref 100–200)
HDL CHOL: 45 mg/dL — ABNORMAL LOW (ref >=50)
LDL CALC: 138 mg/dL — ABNORMAL HIGH (ref <100)
NON-HDL: 155 mg/dL (ref <=190)
TRIGLYCERIDES: 91 mg/dL (ref <150)
VLDL CALC: 16 mg/dL (ref <30)

## 2024-06-24 ENCOUNTER — Encounter (INDEPENDENT_AMBULATORY_CARE_PROVIDER_SITE_OTHER): Payer: Self-pay | Admitting: NURSE PRACTITIONER

## 2024-06-24 ENCOUNTER — Ambulatory Visit: Payer: Self-pay | Attending: NURSE PRACTITIONER | Admitting: NURSE PRACTITIONER

## 2024-06-24 ENCOUNTER — Other Ambulatory Visit: Payer: Self-pay

## 2024-06-24 VITALS — BP 126/80 | HR 57 | Resp 17 | Ht 61.0 in | Wt 212.0 lb

## 2024-06-24 DIAGNOSIS — Z789 Other specified health status: Secondary | ICD-10-CM | POA: Insufficient documentation

## 2024-06-24 DIAGNOSIS — Z833 Family history of diabetes mellitus: Secondary | ICD-10-CM | POA: Insufficient documentation

## 2024-06-24 DIAGNOSIS — E669 Obesity, unspecified: Secondary | ICD-10-CM | POA: Insufficient documentation

## 2024-06-24 DIAGNOSIS — Z6841 Body Mass Index (BMI) 40.0 and over, adult: Secondary | ICD-10-CM | POA: Insufficient documentation

## 2024-06-24 DIAGNOSIS — E782 Mixed hyperlipidemia: Secondary | ICD-10-CM | POA: Insufficient documentation

## 2024-06-24 DIAGNOSIS — I10 Essential (primary) hypertension: Secondary | ICD-10-CM | POA: Insufficient documentation

## 2024-06-24 DIAGNOSIS — Z794 Long term (current) use of insulin: Secondary | ICD-10-CM | POA: Insufficient documentation

## 2024-06-24 DIAGNOSIS — E1165 Type 2 diabetes mellitus with hyperglycemia: Secondary | ICD-10-CM | POA: Insufficient documentation

## 2024-06-24 DIAGNOSIS — E1140 Type 2 diabetes mellitus with diabetic neuropathy, unspecified: Secondary | ICD-10-CM | POA: Insufficient documentation

## 2024-06-24 MED ORDER — INSULIN DEGLUDEC (U-200) 200 UNIT/ML (3 ML) SUBCUTANEOUS PEN: PEN_INJECTOR | SUBCUTANEOUS | 2 refills | Status: DC

## 2024-06-24 MED ORDER — INSULIN LISPRO (U-100) 100 UNIT/ML SUBCUTANEOUS PEN
PEN_INJECTOR | SUBCUTANEOUS | 2 refills | Status: DC
Start: 2024-06-24 — End: 2024-07-01

## 2024-06-24 MED ORDER — PEN NEEDLE, DIABETIC 32 GAUGE X 5/32"
3 refills | Status: AC
Start: 2024-06-24 — End: ?

## 2024-06-24 MED ORDER — GVOKE HYPOPEN 2-PACK 1 MG/0.2 ML SUBCUTANEOUS AUTO-INJECTOR
AUTO-INJECTOR | SUBCUTANEOUS | 2 refills | Status: AC
Start: 2024-06-24 — End: ?

## 2024-06-24 NOTE — Progress Notes (Signed)
 Endocrinology, [facility]  [address]  [place] [address]  [phone]    Progress Note    Date of Service:  06/24/2024  Jeanne [name], Jeanne [name], 73 y.o. female  Date of Birth:  1951-06-17  PCP: Josetta Gaucher, DO     HPI:  Jeanne [name] presents to Endocrinology clinic for a follow-up for Type 2 diabetes complicated by other comorbid conditions including: hypertension, hyperlipidemia and obesity. Diagnosed with diabetes in 1997, at age 34. Family history is Positive for Type 2 diabetes- father.  Most recent A1c is 8.6 - indicative of suboptimal glycemic control.  Review of Dexcom download for the last 2 weeks reveals the following:  Average glucose:  233  GMI:  8.9%  In range:  6%  High: 61%  Very high: 33%  Low: 0%  Very low: 0%  Current diabetes medications include:  Tresiba  20 units once daily; Humalog  4 units with brunch and 4 units with dinner plus correction 2:50> 150  Does not  miss medications doses.  Denies difficulty affording medication and/or test strips.   Previous diabetes medication include: Metformin (nausea, diarrhea- even with extended release even with lower doses); Trulicity (nausea); Lantus (rash): Glipizide; Humalog  75/25  Has glucagon  at home: Yes  Diabetes Education in the past: Yes RD in the past - Yes  Lives with: family  Weight has: increased  Any hospitalizations r/t to diabetes over the last year: No  Chronic Complications:  Microvascular - no known history of retinopathy, nephropathy, positive for neuropathy   Macrovascular- positive hx of MI, negative for CVA and TIA  Autonomic Dysfunction - denies gastroparesis, or bowel/bladder dysfunction  Denies foot ulcers, deformities. Does perform foot care at home - follows with podiatry in [place] every 3 months  Experiences hypoglycemia: Yes. Symptoms of hypoglycemia present during an event: lethargy, sweats and shakes Experiences hypoglycemia: rarely  Up to date with opthamology exam: Yes, 09/2023, Dr. Sophia,  [place], [place]  Steroids recently: No  History of frequent UTI: No  History of frequent genital yeast infection: No  History of pancreatitis: No  Personal or family Multiple Endocrine Neoplasia Type 2 (MEN2): No     06/19/23: Glycemic control improved.  Unfortunately, Jeanne [name] did not start the Ozempic .  After talking with family members she was even more scared of side effects.  She has been taking Tresiba  34 units and not 32 units as prescribed.  Occasional fasting hypoglycemia.    09/27/23: Glycemic control has continued to improve per last labs.  Pt currently being treated for a diverticulitis flare and daughter reports she has not been eating as much and because she has not been eating as much sometimes she skips her prandial insulin  and fear of hypoglycemia.  12/03/23: Glycemic control improving per CGM download.  She is having some postprandial hyperglycemia.  03/05/24: Glycemic control improved. Continues to have some postprandial hyperglycemia, more with dinner.  06/24/24:  Glycemic control has worsened since her most recent lab results.  See GMS showing postprandial hyperglycemia.  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  She did not get a new Dexcom very receiver and met with CDE blood sugars were running higher so Tresiba  was restarted and prandial insulin  was adjusted.  Patient's daughter feels that since cardiac stents were placed patient is doing better does not need as much insulin  which is why she is having hypoglycemia.  She is getting ready to start cardiac rehab.  I have personally reviewed allergies, chief complaint, medications, past medical history, surgical history, and family history.  I have reviewed referral notes, PCP progress notes and labs relevant to this visit.    Past Medical History:   Diagnosis Date    Clouded crystalline lens     Contact dermatitis and eczema      Depressive disorder     Diverticulitis     Generalized anxiety disorder     High cholesterol     HTN (hypertension)     Hyperglycemia     Hypomagnesemia     IBS (irritable bowel syndrome)     Mixed hyperlipidemia     Premature atrial contraction     T2DM (type 2 diabetes mellitus)     Unspecified cataract     Vision loss     Vitamin D deficiency       Past Surgical History:   Procedure Laterality Date    CESAREAN SECTION      HX CATARACT REMOVAL      HX HYSTERECTOMY  1983    HX LAP CHOLECYSTECTOMY      HX PARTIAL HYSTERECTOMY      INCONTINENCE SURGERY  12/09/2021    KNEE SURGERY      Knee replacements    SHOULDER SURGERY        Social History     Tobacco Use    Smoking status: Never     Passive exposure: Never    Smokeless tobacco: Never   Vaping Use    Vaping status: Never Used   Substance Use Topics    Alcohol use: Never    Drug use: Never       Family Medical History:       Problem Relation (Age of Onset)    Anesthesia Complications Mother    Asthma Brother    Cancer Mother    Cervical Cancer Mother    Diabetes Father    Hypertension (High Blood Pressure) Father    Lung disease Father           Outpatient Medications Marked as Taking for the 06/24/24 encounter (Office Visit) with Olinda Carrier, APRN   Medication Sig    amLODIPine (NORVASC) 5 mg Oral Tablet Take 1 Tablet (5 mg total) by mouth Once a day    aspirin (ECOTRIN) 81 mg Oral Tablet, Delayed Release (E.C.) Take 1 Tablet (81 mg total) by mouth    Blood Sugar Diagnostic (BLOOD GLUCOSE TEST) Strip Use to monitor blood sugar four times daily for CGM malfunction    cholecalciferol, vitamin D3, 1,250 mcg (50,000 unit) Oral Capsule SMARTSIG:1 CAPLET BY MOUTH ONCE A WEEK    clopidogreL (PLAVIX) 75 mg Oral Tablet Take 1 Tablet (75 mg total) by mouth Once a day    glucagon  (GVOKE HYPOPEN  2-PACK) 1 mg/0.2 mL Subcutaneous Auto-Injector To inject IM or SubQ for severe hypoglycemia, may repeat in 15 minutes prn    insulin  degludec 200 unit/mL (3 mL) Subcutaneous Insulin   Pen Inject 20 units once daily, adjust as directed, MDD 40    insulin  lispro (HUMALOG  KWIKPEN INSULIN ) 100 unit/mL Subcutaneous Insulin  Pen Inject 6 units with bunch and 6 units dinner plus correction 2 units for every 50 greater than 150 with meals, MDD 50    lancets (UNIVERSAL 1 LANCETS) 26 gauge Misc Fingersticks 4 times daily and as needed.    losartan (COZAAR) 50 mg Oral Tablet Take 0.5 Tablets (25 mg total) by  mouth Once a day    magnesium oxide (MAG-OX) 400 mg Oral Tablet Take 1 Tablet (400 mg total) by mouth Once a day    Pen Needle, Disposable, 32 gauge x 5/32 Needle To use 3 times daily with insulin     sertraline (ZOLOFT) 100 mg Oral Tablet Take 1 Tablet (100 mg total) by mouth Once a day      Allergies   Allergen Reactions    Penicillins Anaphylaxis    Dulaglutide Rash    Insulin  Glargine Rash and Hives/ Urticaria    Isosorbide Mental Status Effect and  Other Adverse Reaction (Add comment)    Lisinopril  Other Adverse Reaction (Add comment)     Constant cough    Statins-Hmg-Coa Reductase Inhibitors Diarrhea, Nausea/ Vomiting and Myalgia     Duricef(Cefadroxil), Trulicity Solution Pen Injector.        Review of Systems:  Review of Systems   Constitutional:  Negative for malaise/fatigue.   Eyes:  Negative for blurred vision.   Respiratory:  Negative for shortness of breath.    Cardiovascular:  Negative for chest pain and leg swelling.   Gastrointestinal:  Negative for abdominal pain, diarrhea, nausea and vomiting.   Genitourinary:  Negative for frequency.   Endo/Heme/Allergies:  Negative for polydipsia.          BP 126/80   Pulse 57   Resp 17   Ht 1.549 m (5' 1)   Wt 96.2 kg (212 lb)   SpO2 93%   BMI 40.06 kg/m       Physical Exam  Vitals reviewed.   Constitutional:       Appearance: Normal appearance. She is obese.   HENT:      Head: Normocephalic.   Cardiovascular:      Rate and Rhythm: Normal rate and regular rhythm.   Pulmonary:      Effort: Pulmonary effort is normal.      Breath sounds:  Normal breath sounds.   Musculoskeletal:      Cervical back: Neck supple.   Skin:     General: Skin is warm and dry.   Neurological:      General: No focal deficit present.      Mental Status: She is alert and oriented to person, place, and time.            Data Reviewed:    A1C: 8.6  A1C Date: 06/16/2024  COMPREHENSIVE METABOLIC PANEL   Lab Results   Component Value Date    SODIUM 144 06/16/2024    POTASSIUM 4.0 06/16/2024    CHLORIDE 105 06/16/2024    CO2 27 06/16/2024    ANIONGAP 12 06/16/2024    BUN 9 06/16/2024    CREATININE 0.58 (L) 06/16/2024    CALCIUM 9.0 06/16/2024    ALBUMIN 3.6 06/16/2024    TOTALPROTEIN 6.6 06/16/2024    ALKPHOS 82 06/16/2024    AST 19 06/16/2024    ALT <7 06/16/2024         LIPID PROFILE  Lab Results   Component Value Date    CHOLESTEROL 200 06/16/2024    HDLCHOL 45 (L) 06/16/2024    LDLCHOL 138 (H) 06/16/2024    TRIG 91 06/16/2024       THYROID  STIMULATING HORMONE  Lab Results   Component Value Date    TSH 2.964 06/16/2024        Urine Microalb/cr ratio   Lab Results   Component Value Date/Time    MICALBCRERAT 24.4 06/16/2024 09:14 AM  Outside Lab Results 09/23/21:  HgbA1C; 8.4  GFR: 100  GFR: 0.5  Albumin/creatinine: 56.3        Assessment/Plan:  Assessment/Plan   1. Type 2 diabetes mellitus with hyperglycemia, with long-term current use of insulin  (CMS HCC)    2. Mixed hyperlipidemia    3. Essential hypertension             ICD-10-CM    1. Type 2 diabetes mellitus with hyperglycemia, with long-term current use of insulin  (CMS HCC)  E11.65 BASIC METABOLIC PANEL    Z79.4 HGA1C (HEMOGLOBIN A1C WITH EST AVG GLUCOSE)     insulin  lispro (HUMALOG  KWIKPEN INSULIN ) 100 unit/mL Subcutaneous Insulin  Pen     glucagon  (GVOKE HYPOPEN  2-PACK) 1 mg/0.2 mL Subcutaneous Auto-Injector     insulin  degludec 200 unit/mL (3 mL) Subcutaneous Insulin  Pen     Pen Needle, Disposable, 32 gauge x 5/32 Needle      2. Mixed hyperlipidemia  E78.2       3. Essential hypertension  I10               Orders Placed This Encounter    BASIC METABOLIC PANEL    HGA1C (HEMOGLOBIN A1C WITH EST AVG GLUCOSE)    insulin  lispro (HUMALOG  KWIKPEN INSULIN ) 100 unit/mL Subcutaneous Insulin  Pen    glucagon  (GVOKE HYPOPEN  2-PACK) 1 mg/0.2 mL Subcutaneous Auto-Injector    insulin  degludec 200 unit/mL (3 mL) Subcutaneous Insulin  Pen    Pen Needle, Disposable, 32 gauge x 5/32 Needle      Return in about 3 months (around 09/23/2024).    Type 2 diabetes - reviewed Dexcom download and labs with patient and daughter.  Continue Tresiba  20 units once daily.  Increase Humalog  to 6 units with brunch and dinner and continue correction scale.  To stop by the office in 2-3 weeks for [facility] download and further insulin  adjustment as necessary.  Goal A1c 7.0%.  Hypertension - at goal continue management per PCP  Hyperlipidemia - LDL not at goal,  intolerant to statins d/t myalgias  Neuropathy - reviewed importance of foot care and proper shoes, continue to follow with podiatry regularly  Obesity - to continue to work on lifestyle modifications for weight loss    Microabuminuria-  Improved and within goal.  Glycemic control worsened, needs tighter glucose control.  Continue Losartan          This note may have been partially generated using MModal Fluency Direct system, and there may be some incorrect words, spellings, and punctuation that were not noted in checking the note before saving.    Levon Connors, APRN

## 2024-06-24 NOTE — Patient Instructions (Signed)
-  Continue Tresiba  20 units once daily  -Increase Humalog  to 6 units with brunch and dinner and continue correction scale  -Stop by the office in 2-3 weeks to download Dexcom receiver  -Follow up with Levon in 3 months  -Call for any questions or concerns  -Call for 2 or more blood sugars less than 80 in one week, or any noted pattern of high or low blood sugars      Hypoglycemia (Low Blood Sugar/ Under 80)  How to avoid hypoglycemia:  1. Eat meals and snacks on time  2. Check blood sugar regularly  3. Be mindful of activity and ALWAYS check blood sugar before activity/exercise and before driving  4. ALWAYS have a fast acting source of glucose with you at all times (glucose tablets, glucose gel, smarties, hard candy, or juice box)  TREATMENT  1. Test Blood Sugar at the fingertip - if unable to test blood sugar - treat like it is low  2. Treatment - Rule of 15 eat or drink 15 grams of fast acting carbs (example: 1/2 cup of juice or regular soda, 1 cup low fat or skim milk, 4 glucose tablets, 6 lifesavers)  *If unable to eat use glucagon  injection AND call 911  3. Recheck blood sugar in 15 minutes  If blood sugar is less than 80- repeat rule of 15 (as above)  If blood sugar is greater than 80 - be mindful, continue checking blood sugar

## 2024-06-27 ENCOUNTER — Other Ambulatory Visit: Payer: Self-pay

## 2024-06-27 ENCOUNTER — Ambulatory Visit: Admission: RE | Admit: 2024-06-27 | Discharge: 2024-06-27 | Disposition: A | Payer: Self-pay | Source: Ambulatory Visit

## 2024-06-30 ENCOUNTER — Ambulatory Visit
Admission: RE | Admit: 2024-06-30 | Discharge: 2024-06-30 | Disposition: A | Discharge status: Home / Self Care | Payer: Self-pay | Source: Ambulatory Visit

## 2024-06-30 ENCOUNTER — Other Ambulatory Visit: Payer: Self-pay

## 2024-06-30 DIAGNOSIS — Z955 Presence of coronary angioplasty implant and graft: Secondary | ICD-10-CM | POA: Insufficient documentation

## 2024-07-01 ENCOUNTER — Other Ambulatory Visit (INDEPENDENT_AMBULATORY_CARE_PROVIDER_SITE_OTHER): Payer: Self-pay | Admitting: NURSE PRACTITIONER

## 2024-07-01 DIAGNOSIS — Z794 Long term (current) use of insulin: Secondary | ICD-10-CM

## 2024-07-01 MED ORDER — INSULIN LISPRO (U-100) 100 UNIT/ML SUBCUTANEOUS PEN: PEN_INJECTOR | SUBCUTANEOUS | 2 refills | Status: DC

## 2024-07-01 NOTE — Telephone Encounter (Signed)
 Pharmacy faxed clarification on insulin  lispro.  Nmt, lpn

## 2024-07-02 ENCOUNTER — Other Ambulatory Visit: Payer: Self-pay

## 2024-07-02 ENCOUNTER — Ambulatory Visit
Admission: RE | Admit: 2024-07-02 | Discharge: 2024-07-02 | Disposition: A | Discharge status: Home / Self Care | Payer: Self-pay | Source: Ambulatory Visit

## 2024-07-02 DIAGNOSIS — Z955 Presence of coronary angioplasty implant and graft: Secondary | ICD-10-CM | POA: Insufficient documentation

## 2024-07-03 ENCOUNTER — Emergency Department (HOSPITAL_COMMUNITY)

## 2024-07-03 ENCOUNTER — Ambulatory Visit (HOSPITAL_COMMUNITY): Payer: Self-pay

## 2024-07-03 ENCOUNTER — Other Ambulatory Visit: Payer: Self-pay

## 2024-07-03 ENCOUNTER — Emergency Department
Admission: EM | Admit: 2024-07-03 | Discharge: 2024-07-03 | Disposition: A | Discharge status: Home / Self Care | Attending: Emergency Medicine | Admitting: Emergency Medicine

## 2024-07-03 ENCOUNTER — Encounter (HOSPITAL_COMMUNITY): Payer: Self-pay

## 2024-07-03 DIAGNOSIS — M25551 Pain in right hip: Secondary | ICD-10-CM | POA: Insufficient documentation

## 2024-07-03 MED ORDER — DEXAMETHASONE SODIUM PHOSPHATE (PF) 10 MG/ML INJECTION SOLUTION
10.0000 mg | INTRAMUSCULAR | Status: AC
  Administered 2024-07-03: 10 mg via INTRAMUSCULAR
  Filled 2024-07-03: qty 1

## 2024-07-03 MED ORDER — METHOCARBAMOL 750 MG TABLET
1500.0000 mg | ORAL_TABLET | Freq: Three times a day (TID) | ORAL | 0 refills | Status: AC | PRN
Start: 2024-07-03 — End: ?

## 2024-07-03 MED ORDER — KETOROLAC 30 MG/ML (1 ML) INJECTION SOLUTION
15.0000 mg | INTRAMUSCULAR | Status: AC
Start: 2024-07-03 — End: 2024-07-03
  Administered 2024-07-03: 15 mg via INTRAMUSCULAR
  Filled 2024-07-03: qty 1

## 2024-07-03 NOTE — Discharge Instructions (Addendum)
 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  You may have strained muscles, bursitis or your arthritis that is causing pain.  You should be contacted to schedule follow up with the orthopedic clinic.  In the meantime, you can try the Robaxin, diclofenac cream, ibuprofen and Tylenol.

## 2024-07-03 NOTE — ED Nurses Note (Signed)
Pt and daughter provided with AVS and instructed on follow up care and medications. Pt denies questions. Pt declined wheelchair out of ER. Pt ambulated out of ER in stable condition.

## 2024-07-03 NOTE — ED Provider Notes (Signed)
 Emergency Medicine  Attending Only Note    Name: Jeanne [name]  Age and Gender: 73 y.o. female  Date of Birth: [birth year]  MRN: [id number]  PCP: Josetta Gaucher, DO    CC:  Chief Complaint   Patient presents with    Back Pain     74 year old female presented to ER with complaint of right leg pain denies injury        HPI:  Jeanne [name] is a 73 y.o. female with history of hypertension, DM 2, cardiac morbid obesity who presents with right-sided hip pain.  This began on Monday.  Says that she started cardiac rehab on Monday and pain has worsened through the week, she did cardiac rehab yesterday as well but had to do a short amount of time.  No specific known injury.  Has had issues with her left hip in the past and received a steroid injection.    Past Medical History:   Diagnosis Date    Clouded crystalline lens     Contact dermatitis and eczema     Depressive disorder     Diverticulitis     Generalized anxiety disorder     High cholesterol     HTN (hypertension)     Hyperglycemia     Hypomagnesemia     IBS (irritable bowel syndrome)     Mixed hyperlipidemia     Premature atrial contraction     T2DM (type 2 diabetes mellitus)     Unspecified cataract     Vision loss     Vitamin D deficiency         Allergies[1]   Past Surgical History:   Procedure Laterality Date    CESAREAN SECTION      HX CATARACT REMOVAL      HX HYSTERECTOMY  1983    HX LAP CHOLECYSTECTOMY      HX PARTIAL HYSTERECTOMY      INCONTINENCE SURGERY  12/09/2021    KNEE SURGERY      Knee replacements    SHOULDER SURGERY            Objective:  ED Triage Vitals [07/03/24 1335]   BP (Non-Invasive) (!) 194/98   Heart Rate 82   Respiratory Rate 17   Temperature 37 C (98.6 F)   SpO2 96 %   Weight 96.2 kg (212 lb)   Height 1.54 m (5' 0.63)     Filed Vitals:    07/03/24 1335   BP: (!) 194/98   Pulse: 82   Resp: 17   Temp: 37 C (98.6 F)   SpO2: 96%       Constitutional - No acute distress.  Alert and Active.  HEENT - Normocephalic. Atraumatic. PERRL. EOMI.  Conjunctiva clear. Oropharynx with no erythema, lesions, or exudates. Moist mucous membranes.   Neck - Trachea midline. No stridor. No hoarseness.  Musculoskeletal - tenderness over right lateral hip.  Sensation grossly intact to bilateral lower extremities.  No external signs of trauma or other abnormal lesions visible on skin exam of right hip.  Otherwise:  Good AROM. No muscle or joint tenderness appreciated. No clubbing, cyanosis or edema.  Skin - Warm and dry, without any rashes or other lesions.  Neuro - Cranial nerves II-XII are grossly intact.  Moving all extremities symmetrically.    MDM/ED Course:  Differential would include arthritis, bursitis, muscle strains, unlikely fracture.  X-rays negative for acute findings, they show degenerative changes.  Patient reported no improvement after Toradol  and IM Decadron in the ED.  Will have her follow up with Orthopedics outpatient, script for Robaxin sent, also recommended she try diclofenac cream which she notes she can get over-the-counter.     Orders Placed This Encounter    XR HIP RIGHT W PELVIS 2-3 VIEWS    FOLLOW-UP: ORTHOPEDICS - [facility] (2ND FLR - [address]) - [place], [place]    ketorolac (TORADOL) 30 mg/mL injection    dexAMETHasone (PF) 10 mg/mL injection    methocarbamoL (ROBAXIN) 750 mg Oral Tablet     Impression/Problems Addressed During This Visit:   Clinical Impression   Right hip pain (Primary)     All problems are acute unless otherwise noted.  Problem list today includes, but not limited to, the following ( [x]  designates a listed problem contains this complexity)  [ x ] Exacerbation, progression or side effect of treatment of chronic illness  [  ] A problem is undiagnosed and potentially high risk  [  ] Acute Illness with Systemic Symptoms  [  ] Acute complex injury  [  ] Complaint with unanticipated symptoms  ----  [  ] Severe exacerbation or progression of chronic problem  [  ] Problem/injury with (potential)  danger to life or bodily function    Disposition: Discharged    Discharge Instructions Given to Patient:  Your x-rays show arthritis, degenerative changes to your hips. You may have strained muscles, bursitis or your arthritis that is causing pain. You should be contacted to schedule follow up with the orthopedic clinic. In the meantime, you can try the Robaxin, diclofenac cream, ibuprofen and Tylenol.     Portions of this note may have been dictated using voice recognition software.     Beverley Andrew, MD  [place]  [facility]    -----------------------  Billing information:  Data:  #Any independent historian:   #Any external data reviewed:   #Independent review of imaging and/or ECG discussed in MDM above.  #Any consultation with other providers discussed in MDM above.    Treatment:  #Any decision to admit reviewed in MDM above.  #DNR/de-escalation of care discussed: no  #Diagnosis or treatment limited by social determinant of health: no    Surgery:  Major [  ]  or minor [  ]  procedure discussed.  With [  ] or without [  ] risk factors potentially influencing procedure outcome or complications.  Medical Decision Making  Amount and/or Complexity of Data Reviewed  Radiology: ordered.    Risk  Prescription drug management.      No results found for this or any previous visit (from the past 12 hours).  XR HIP RIGHT W PELVIS 2-3 VIEWS   Final Result   Degenerative changes.         Radiologist location ID: [id number]                  [1]   Allergies  Allergen Reactions    Penicillins Anaphylaxis    Dulaglutide Rash    Insulin  Glargine Rash and Hives/ Urticaria    Isosorbide Mental Status Effect and  Other Adverse Reaction (Add comment)    Lisinopril  Other Adverse Reaction (Add comment)     Constant cough    Statins-Hmg-Coa Reductase Inhibitors Diarrhea, Nausea/ Vomiting and Myalgia     Duricef(Cefadroxil), Trulicity Solution Pen Injector.

## 2024-07-04 ENCOUNTER — Telehealth (HOSPITAL_COMMUNITY): Payer: Self-pay

## 2024-07-04 NOTE — Telephone Encounter (Signed)
 Post Ed Follow-Up    Post ED Follow-Up:   Document completed and/or attempted interactive contact(s) after transition to home after emergency department stay.:   Transition Facility and relevant Date:   Discharge Date: 07/03/24  Discharge from [facility] Emergency Department?: Yes  Discharge Facility: [facility]  Contacted by: Jalene Longs RN  Contact method: Patient/Caregiver Telephone  Contact completed: 07/04/2024 12:34 PM  MyChart message sent?: No  Was the AVS reviewed with patient?: Yes  Did the patient attempt to reach their PCP prior to going to the ED?: No  How is the patient recovering?: Improving  Medications prescribed: Yes  Were they obtained?: Yes  Interventions: No needs identified  Follow Up Visit: Appointment already scheduled  10/31 with Ortho  AVS Recommendations and Instructions reiterated. Daughter denies questions or concerns related to AVS at this time. NN line info provided.   Longs Jalene, RN

## 2024-07-07 ENCOUNTER — Ambulatory Visit (HOSPITAL_COMMUNITY): Payer: Self-pay

## 2024-07-09 ENCOUNTER — Ambulatory Visit (HOSPITAL_COMMUNITY): Payer: Self-pay

## 2024-07-10 ENCOUNTER — Ambulatory Visit (HOSPITAL_COMMUNITY): Payer: Self-pay

## 2024-07-14 ENCOUNTER — Other Ambulatory Visit: Payer: Self-pay

## 2024-07-14 ENCOUNTER — Ambulatory Visit
Admission: RE | Admit: 2024-07-14 | Discharge: 2024-07-14 | Disposition: A | Discharge status: Home / Self Care | Payer: Self-pay | Source: Ambulatory Visit

## 2024-07-14 DIAGNOSIS — Z955 Presence of coronary angioplasty implant and graft: Secondary | ICD-10-CM | POA: Insufficient documentation

## 2024-07-16 ENCOUNTER — Ambulatory Visit (HOSPITAL_COMMUNITY): Payer: Self-pay

## 2024-07-17 ENCOUNTER — Ambulatory Visit (HOSPITAL_COMMUNITY): Payer: Self-pay

## 2024-07-21 ENCOUNTER — Ambulatory Visit (HOSPITAL_COMMUNITY): Payer: Self-pay

## 2024-07-23 ENCOUNTER — Ambulatory Visit (HOSPITAL_COMMUNITY): Payer: Self-pay

## 2024-07-24 ENCOUNTER — Ambulatory Visit (HOSPITAL_COMMUNITY): Payer: Self-pay

## 2024-07-25 ENCOUNTER — Ambulatory Visit (HOSPITAL_COMMUNITY): Payer: Self-pay | Admitting: Student in an Organized Health Care Education/Training Program

## 2024-07-28 ENCOUNTER — Ambulatory Visit (HOSPITAL_COMMUNITY): Payer: Self-pay

## 2024-07-30 ENCOUNTER — Ambulatory Visit (HOSPITAL_COMMUNITY): Payer: Self-pay

## 2024-07-31 ENCOUNTER — Ambulatory Visit (HOSPITAL_COMMUNITY): Payer: Self-pay

## 2024-08-04 ENCOUNTER — Ambulatory Visit (HOSPITAL_COMMUNITY): Payer: Self-pay

## 2024-08-06 ENCOUNTER — Ambulatory Visit (HOSPITAL_COMMUNITY): Payer: Self-pay

## 2024-08-07 ENCOUNTER — Ambulatory Visit (HOSPITAL_COMMUNITY): Payer: Self-pay

## 2024-08-11 ENCOUNTER — Ambulatory Visit (HOSPITAL_COMMUNITY): Payer: Self-pay

## 2024-08-13 ENCOUNTER — Ambulatory Visit (HOSPITAL_COMMUNITY): Payer: Self-pay

## 2024-08-14 ENCOUNTER — Ambulatory Visit (HOSPITAL_COMMUNITY): Payer: Self-pay

## 2024-08-18 ENCOUNTER — Ambulatory Visit (HOSPITAL_COMMUNITY): Payer: Self-pay

## 2024-08-20 ENCOUNTER — Ambulatory Visit (HOSPITAL_COMMUNITY): Payer: Self-pay

## 2024-08-25 ENCOUNTER — Ambulatory Visit (HOSPITAL_COMMUNITY): Payer: Self-pay

## 2024-08-27 ENCOUNTER — Ambulatory Visit (HOSPITAL_COMMUNITY): Payer: Self-pay

## 2024-08-28 ENCOUNTER — Ambulatory Visit (HOSPITAL_COMMUNITY): Payer: Self-pay

## 2024-09-01 ENCOUNTER — Ambulatory Visit (HOSPITAL_COMMUNITY): Payer: Self-pay

## 2024-09-03 ENCOUNTER — Ambulatory Visit (HOSPITAL_COMMUNITY): Payer: Self-pay

## 2024-09-04 ENCOUNTER — Ambulatory Visit (HOSPITAL_COMMUNITY): Payer: Self-pay

## 2024-09-08 ENCOUNTER — Ambulatory Visit (HOSPITAL_COMMUNITY): Payer: Self-pay

## 2024-09-10 ENCOUNTER — Ambulatory Visit (HOSPITAL_COMMUNITY): Payer: Self-pay

## 2024-09-11 ENCOUNTER — Ambulatory Visit (HOSPITAL_COMMUNITY): Payer: Self-pay

## 2024-09-15 ENCOUNTER — Ambulatory Visit (HOSPITAL_COMMUNITY): Payer: Self-pay

## 2024-09-17 ENCOUNTER — Ambulatory Visit (HOSPITAL_COMMUNITY): Payer: Self-pay

## 2024-09-24 ENCOUNTER — Ambulatory Visit: Attending: NURSE PRACTITIONER

## 2024-09-24 ENCOUNTER — Other Ambulatory Visit: Payer: Self-pay

## 2024-09-24 DIAGNOSIS — Z794 Long term (current) use of insulin: Secondary | ICD-10-CM | POA: Insufficient documentation

## 2024-09-24 DIAGNOSIS — E1165 Type 2 diabetes mellitus with hyperglycemia: Secondary | ICD-10-CM | POA: Insufficient documentation

## 2024-09-24 LAB — HGA1C (HEMOGLOBIN A1C WITH EST AVG GLUCOSE)
ESTIMATED AVERAGE GLUCOSE: 177 mg/dL
HEMOGLOBIN A1C: 7.8 % — ABNORMAL HIGH (ref 4.0–6.0)

## 2024-09-24 LAB — BASIC METABOLIC PANEL
ANION GAP: 11 mmol/L (ref 4–13)
BUN/CREA RATIO: 16 (ref 6–22)
BUN: 11 mg/dL (ref 8–25)
CALCIUM: 9.5 mg/dL (ref 8.6–10.3)
CHLORIDE: 102 mmol/L (ref 96–111)
CO2 TOTAL: 29 mmol/L (ref 23–31)
CREATININE: 0.67 mg/dL (ref 0.60–1.05)
GLUCOSE: 208 mg/dL — ABNORMAL HIGH (ref 65–125)
POTASSIUM: 3.4 mmol/L — ABNORMAL LOW (ref 3.5–5.1)
SODIUM: 142 mmol/L (ref 136–145)
eGFRcr - FEMALE: >90 mL/min/1.73mˆ2 (ref >=60)

## 2024-10-01 ENCOUNTER — Other Ambulatory Visit: Payer: Self-pay

## 2024-10-01 ENCOUNTER — Ambulatory Visit: Payer: Self-pay | Attending: NURSE PRACTITIONER | Admitting: NURSE PRACTITIONER

## 2024-10-01 ENCOUNTER — Encounter (INDEPENDENT_AMBULATORY_CARE_PROVIDER_SITE_OTHER): Payer: Self-pay | Admitting: NURSE PRACTITIONER

## 2024-10-01 VITALS — BP 138/90 | HR 73 | Resp 17 | Ht 60.63 in | Wt 209.0 lb

## 2024-10-01 DIAGNOSIS — I10 Essential (primary) hypertension: Secondary | ICD-10-CM | POA: Insufficient documentation

## 2024-10-01 DIAGNOSIS — Z794 Long term (current) use of insulin: Secondary | ICD-10-CM | POA: Insufficient documentation

## 2024-10-01 DIAGNOSIS — E782 Mixed hyperlipidemia: Secondary | ICD-10-CM | POA: Insufficient documentation

## 2024-10-01 DIAGNOSIS — Z6839 Body mass index (BMI) 39.0-39.9, adult: Secondary | ICD-10-CM | POA: Insufficient documentation

## 2024-10-01 DIAGNOSIS — E1165 Type 2 diabetes mellitus with hyperglycemia: Secondary | ICD-10-CM | POA: Insufficient documentation

## 2024-10-01 DIAGNOSIS — E669 Obesity, unspecified: Secondary | ICD-10-CM | POA: Insufficient documentation

## 2024-10-01 MED ORDER — INSULIN LISPRO (U-100) 100 UNIT/ML SUBCUTANEOUS PEN: PEN_INJECTOR | SUBCUTANEOUS | 2 refills | Status: AC

## 2024-10-01 MED ORDER — INSULIN DEGLUDEC (U-200) 200 UNIT/ML (3 ML) SUBCUTANEOUS PEN: PEN_INJECTOR | SUBCUTANEOUS | 2 refills | Status: AC

## 2024-10-01 NOTE — Progress Notes (Signed)
 Endocrinology, [facility]  [address]  [place] [address]  [phone]    Progress Note    Date of Service:  10/01/2024  Jeanne [name], Jeanne [name], 75 y.o. female  Date of Birth:  1951-07-17  PCP: Jeanne Gaucher, DO    HPI:  Jeanne [name] presents to Endocrinology clinic for a follow-up for Type 2 diabetes complicated by other comorbid conditions including: hypertension, hyperlipidemia and obesity. Diagnosed with diabetes in 1997, at age 72. Family history is Positive for Type 2 diabetes- father.  Most recent A1c is 7.8 - indicative of suboptimal glycemic control.  Review of Dexcom download for the last 2 weeks reveals the following:  Average glucose:  238  GMI:  9.0%  In range:  10%  High: 55%  Very high: 35%  Low: 0%  Very low: 0%  Current diabetes medications include:  Tresiba  20 units once daily; Humalog  6 units with brunch and 6 units with dinner plus correction 2:50> 150  Does not  miss medications doses.  Denies difficulty affording medication and/or test strips.   Previous diabetes medication include: Metformin (nausea, diarrhea- even with extended release even with lower doses); Trulicity (nausea); Lantus (rash): Glipizide; Humalog  75/25  Has glucagon  at home: Yes  Diabetes Education in the past: Yes RD in the past - Yes  Lives with: family  Weight has: decreased  Any hospitalizations r/t to diabetes over the last year: No  Chronic Complications:  Microvascular - no known history of retinopathy, nephropathy, positive for neuropathy   Macrovascular- positive hx of MI, negative for CVA and TIA  Autonomic Dysfunction - denies gastroparesis, or bowel/bladder dysfunction  Denies foot ulcers, deformities. Does perform foot care at home - follows with podiatry in [place] every 3 months  Experiences hypoglycemia: Yes. Symptoms of hypoglycemia present during an event: lethargy, sweats and shakes Experiences hypoglycemia: rarely  Up to date with opthamology exam: Yes, 09/2023, Dr. Sophia,  [place], [place]  Steroids recently: Yes, 10/25  History of frequent UTI: No  History of frequent genital yeast infection: No  History of pancreatitis: No  Personal or family Multiple Endocrine Neoplasia Type 2 (MEN2): No     06/19/23: Glycemic control improved.  Unfortunately, Jeanne [name] did not start the Ozempic .  After talking with family members she was even more scared of side effects.  She has been taking Tresiba  34 units and not 32 units as prescribed.  Occasional fasting hypoglycemia.    09/27/23: Glycemic control has continued to improve per last labs.  Pt currently being treated for a diverticulitis flare and daughter reports she has not been eating as much and because she has not been eating as much sometimes she skips her prandial insulin  and fear of hypoglycemia.  12/03/23: Glycemic control improving per CGM download.  She is having some postprandial hyperglycemia.  03/05/24: Glycemic control improved. Continues to have some postprandial hyperglycemia, more with dinner.  06/24/24:  Glycemic control has worsened since her most recent lab results.  CGM showing postprandial hyperglycemia.  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  She did not get a new Dexcom very receiver and met with CDE blood sugars were running higher so Tresiba  was restarted and prandial insulin  was adjusted.  Patient's daughter feels that since cardiac stents were placed patient is doing better does not need as much insulin  which is why she is having hypoglycemia.  She is getting ready to start cardiac rehab.  10/01/24: Glycemic control improved her most recent lab results however not change per CGM download.  Patient denies hypoglycemia.  Daughter reports she received a steroid injection in October and blood sugars have been running higher since.  I have personally reviewed allergies, chief complaint, medications, past medical  history, surgical history, and family history.  I have reviewed referral notes, PCP progress notes and labs relevant to this visit.    Past Medical History:   Diagnosis Date    Clouded crystalline lens     Contact dermatitis and eczema     Depressive disorder     Diverticulitis     Generalized anxiety disorder     High cholesterol     HTN (hypertension)     Hyperglycemia     Hypomagnesemia     IBS (irritable bowel syndrome)     Mixed hyperlipidemia     Premature atrial contraction     T2DM (type 2 diabetes mellitus)     Unspecified cataract     Vision loss     Vitamin D deficiency       Past Surgical History:   Procedure Laterality Date    CESAREAN SECTION      HX CATARACT REMOVAL      HX HYSTERECTOMY  1983    HX LAP CHOLECYSTECTOMY      HX PARTIAL HYSTERECTOMY      INCONTINENCE SURGERY  12/09/2021    KNEE SURGERY      Knee replacements    SHOULDER SURGERY        Social History     Tobacco Use    Smoking status: Never     Passive exposure: Never    Smokeless tobacco: Never   Vaping Use    Vaping status: Never Used   Substance Use Topics    Alcohol use: Never    Drug use: Never       Family Medical History:       Problem Relation (Age of Onset)    Anesthesia Complications Mother    Asthma Brother    Cancer Mother    Cervical Cancer Mother    Diabetes Father    Hypertension (High Blood Pressure) Father    Lung disease Father           Outpatient Medications Marked as Taking for the 10/01/24 encounter (Office Visit) with Jeanne Carrier, APRN, CNP   Medication Sig    amLODIPine (NORVASC) 5 mg Oral Tablet Take 1 Tablet (5 mg total) by mouth Once a day    aspirin (ECOTRIN) 81 mg Oral Tablet, Delayed Release (E.C.) Take 1 Tablet (81 mg total) by mouth    Blood Sugar Diagnostic (BLOOD GLUCOSE TEST) Strip Use to monitor blood sugar four times daily for CGM malfunction    cholecalciferol, vitamin D3, 1,250 mcg (50,000 unit) Oral Capsule SMARTSIG:1 CAPLET BY MOUTH ONCE A WEEK    clopidogreL (PLAVIX) 75 mg Oral Tablet Take 1  Tablet (75 mg total) by mouth Once a day    glucagon  (GVOKE HYPOPEN  2-PACK) 1 mg/0.2 mL Subcutaneous Auto-Injector To inject IM or SubQ for severe hypoglycemia, may repeat in 15 minutes prn    insulin  degludec 200 unit/mL (3 mL) Subcutaneous Insulin  Pen Inject 26 units once daily, adjust as directed, MDD 40    insulin  lispro (HUMALOG  KWIKPEN INSULIN ) 100 unit/mL Subcutaneous Insulin  Pen Inject 10 units with brunch and 10 units dinner plus correction 2 units for every 50 greater  than 150 with meals, MDD 50    lancets (UNIVERSAL 1 LANCETS) 26 gauge Misc Fingersticks 4 times daily and as needed.    losartan (COZAAR) 50 mg Oral Tablet Take 0.5 Tablets (25 mg total) by mouth Once a day    magnesium oxide (MAG-OX) 400 mg Oral Tablet Take 1 Tablet (400 mg total) by mouth Once a day    methocarbamoL  (ROBAXIN ) 750 mg Oral Tablet Take 2 Tablets (1,500 mg total) by mouth Three times a day as needed for Other (back pain/muscle spasms)    Pen Needle, Disposable, 32 gauge x 5/32 Needle To use 3 times daily with insulin     sertraline (ZOLOFT) 100 mg Oral Tablet Take 1 Tablet (100 mg total) by mouth Once a day      Allergies   Allergen Reactions    Penicillins Anaphylaxis    Dulaglutide Rash    Insulin  Glargine Rash and Hives/ Urticaria    Isosorbide Mental Status Effect and  Other Adverse Reaction (Add comment)    Lisinopril  Other Adverse Reaction (Add comment)     Constant cough    Statins-Hmg-Coa Reductase Inhibitors Diarrhea, Nausea/ Vomiting and Myalgia     Duricef(Cefadroxil), Trulicity Solution Pen Injector.        Review of Systems:  Review of Systems   Constitutional:  Negative for malaise/fatigue.   Eyes:  Negative for blurred vision.   Respiratory:  Negative for shortness of breath.    Cardiovascular:  Negative for chest pain and leg swelling.   Gastrointestinal:  Negative for abdominal pain, diarrhea, nausea and vomiting.   Genitourinary:  Negative for frequency.   Endo/Heme/Allergies:  Negative for polydipsia.           BP (!) 138/90   Pulse 73   Resp 17   Ht 1.54 m (5' 0.63)   Wt 94.8 kg (209 lb)   SpO2 94%   BMI 39.97 kg/m       Physical Exam  Vitals reviewed.   Constitutional:       Appearance: Normal appearance. She is obese.   HENT:      Head: Normocephalic.   Cardiovascular:      Rate and Rhythm: Normal rate and regular rhythm.   Pulmonary:      Effort: Pulmonary effort is normal.      Breath sounds: Normal breath sounds.   Musculoskeletal:      Cervical back: Neck supple.   Skin:     General: Skin is warm and dry.   Neurological:      General: No focal deficit present.      Mental Status: She is alert and oriented to person, place, and time.            Data Reviewed:    A1C: 7.8  A1C Date: 09/24/2024  COMPREHENSIVE METABOLIC PANEL   Lab Results   Component Value Date    SODIUM 142 09/24/2024    POTASSIUM 3.4 (L) 09/24/2024    CHLORIDE 102 09/24/2024    CO2 29 09/24/2024    ANIONGAP 11 09/24/2024    BUN 11 09/24/2024    CREATININE 0.67 09/24/2024    CALCIUM 9.5 09/24/2024    ALBUMIN 3.6 06/16/2024    TOTALPROTEIN 6.6 06/16/2024    ALKPHOS 82 06/16/2024    AST 19 06/16/2024    ALT <7 06/16/2024         LIPID PROFILE  Lab Results   Component Value Date    CHOLESTEROL 200 06/16/2024  HDLCHOL 45 (L) 06/16/2024    LDLCHOL 138 (H) 06/16/2024    TRIG 91 06/16/2024       THYROID  STIMULATING HORMONE  Lab Results   Component Value Date    TSH 2.964 06/16/2024        Urine Microalb/cr ratio   Lab Results   Component Value Date/Time    MICALBCRERAT 24.4 06/16/2024 09:14 AM                 Outside Lab Results 09/23/21:  HgbA1C; 8.4  GFR: 100  GFR: 0.5  Albumin/creatinine: 56.3        Assessment/Plan:  Assessment/Plan   1. Type 2 diabetes mellitus with hyperglycemia, with long-term current use of insulin  (CMS HCC)    2. Mixed hyperlipidemia    3. Essential hypertension             ICD-10-CM    1. Type 2 diabetes mellitus with hyperglycemia, with long-term current use of insulin  (CMS HCC)  E11.65 COMPREHENSIVE METABOLIC  PANEL, NON-FASTING    Z79.4 HGA1C (HEMOGLOBIN A1C WITH EST AVG GLUCOSE)     LIPID PANEL     insulin  lispro (HUMALOG  KWIKPEN INSULIN ) 100 unit/mL Subcutaneous Insulin  Pen     insulin  degludec 200 unit/mL (3 mL) Subcutaneous Insulin  Pen      2. Mixed hyperlipidemia  E78.2       3. Essential hypertension  I10              Orders Placed This Encounter    COMPREHENSIVE METABOLIC PANEL, NON-FASTING    HGA1C (HEMOGLOBIN A1C WITH EST AVG GLUCOSE)    LIPID PANEL    insulin  lispro (HUMALOG  KWIKPEN INSULIN ) 100 unit/mL Subcutaneous Insulin  Pen    insulin  degludec 200 unit/mL (3 mL) Subcutaneous Insulin  Pen      Return in about 3 months (around 12/30/2024).    Type 2 diabetes - reviewed Dexcom download and labs with patient and daughter.  Increase Tresiba  26 units once daily and increase Humalog  to 10 units with brunch and dinner plus correction scale.  Continue CGM use.  Follow up with CDE in 1 month for [facility] download and further insulin  adjustment as necessary.  Goal A1c 7%.  Hypertension - close to goal, continue management per PCP  Hyperlipidemia - LDL not at goal,  intolerant to statins d/t myalgias  Neuropathy - continue to follow up with Podiatry and perform daily foot checks  Obesity - has lost a few lb since last appointment, to continue to work on lifestyle modifications for weight loss    Microabuminuria-  Improved and within goal.  Continue losartan.          This note may have been partially generated using MModal Fluency Direct system, and there may be some incorrect words, spellings, and punctuation that were not noted in checking the note before saving.    Levon Connors, APRN

## 2024-10-01 NOTE — Patient Instructions (Signed)
-  Increase Tresiba  to 26 units once daily  -Increase Humalog  to 10 units with brunch and supper plus correction scale  -Continue Dexcom use  -Follow up with Colleen in 1 month  -Follow up with Levon in 3 months, with labs before  -Call for any questions or concerns  -Call for 2 or more blood sugars less than 80 in one week, or any noted pattern of high or low blood sugars    Hypoglycemia (Low Blood Sugar/ Under 80)  How to avoid hypoglycemia:  1. Eat meals and snacks on time  2. Check blood sugar regularly  3. Be mindful of activity and ALWAYS check blood sugar before activity/exercise and before driving  4. ALWAYS have a fast acting source of glucose with you at all times (glucose tablets, glucose gel, smarties, hard candy, or juice box)  TREATMENT  1. Test Blood Sugar at the fingertip - if unable to test blood sugar - treat like it is low  2. Treatment - Rule of 15 eat or drink 15 grams of fast acting carbs (example: 1/2 cup of juice or regular soda, 1 cup low fat or skim milk, 4 glucose tablets, 6 lifesavers)  *If unable to eat use glucagon  injection AND call 911  3. Recheck blood sugar in 15 minutes  If blood sugar is less than 80- repeat rule of 15 (as above)  If blood sugar is greater than 80 - be mindful, continue checking blood sugar

## 2024-10-17 ENCOUNTER — Other Ambulatory Visit (INDEPENDENT_AMBULATORY_CARE_PROVIDER_SITE_OTHER): Payer: Self-pay

## 2024-10-30 ENCOUNTER — Ambulatory Visit: Payer: Self-pay

## 2024-10-30 ENCOUNTER — Other Ambulatory Visit: Payer: Self-pay

## 2024-10-30 DIAGNOSIS — I10 Essential (primary) hypertension: Secondary | ICD-10-CM

## 2024-10-30 DIAGNOSIS — Z794 Long term (current) use of insulin: Secondary | ICD-10-CM

## 2024-10-30 DIAGNOSIS — E782 Mixed hyperlipidemia: Secondary | ICD-10-CM

## 2024-10-30 DIAGNOSIS — I252 Old myocardial infarction: Secondary | ICD-10-CM

## 2024-10-30 DIAGNOSIS — J449 Chronic obstructive pulmonary disease, unspecified: Secondary | ICD-10-CM

## 2024-10-30 NOTE — Progress Notes (Cosign Needed)
 ENDOCRINOLOGY[facility]  [address]  [place] [address]  Operated by [facility]     Name: Jeanne [name] MRN:  [id number]   Date: 10/30/2024 Age: 74 y.o.      DIABETES EDUCATION NOTE    Chief Complaint:  Follow up with CDE in 1 month for Jeanne [name] download and further insulin  adjustment as necessary.      HISTORY:    Past Medical History:   Diagnosis Date    Clouded crystalline lens     Contact dermatitis and eczema     Depressive disorder     Diverticulitis     Generalized anxiety disorder     High cholesterol     HTN (hypertension)     Hyperglycemia     Hypomagnesemia     IBS (irritable bowel syndrome)     Mixed hyperlipidemia     Premature atrial contraction     T2DM (type 2 diabetes mellitus)     Unspecified cataract     Vision loss     Vitamin D deficiency      Current Outpatient Medications   Medication Sig    amLODIPine (NORVASC) 5 mg Oral Tablet Take 1 Tablet (5 mg total) by mouth Once a day    aspirin (ECOTRIN) 81 mg Oral Tablet, Delayed Release (E.C.) Take 1 Tablet (81 mg total) by mouth    Blood Sugar Diagnostic (BLOOD GLUCOSE TEST) Strip Use to monitor blood sugar four times daily for CGM malfunction    cholecalciferol, vitamin D3, 1,250 mcg (50,000 unit) Oral Capsule SMARTSIG:1 CAPLET BY MOUTH ONCE A WEEK    clopidogreL (PLAVIX) 75 mg Oral Tablet Take 1 Tablet (75 mg total) by mouth Once a day    glucagon  (GVOKE HYPOPEN  2-PACK) 1 mg/0.2 mL Subcutaneous Auto-Injector To inject IM or SubQ for severe hypoglycemia, may repeat in 15 minutes prn    insulin  degludec 200 unit/mL (3 mL) Subcutaneous Insulin  Pen Inject 26 units once daily, adjust as directed, MDD 40    insulin  lispro (HUMALOG  KWIKPEN INSULIN ) 100 unit/mL Subcutaneous Insulin  Pen Inject 10 units with brunch and 10 units dinner plus correction 2 units for every 50 greater than 150 with meals, MDD 50    lancets (UNIVERSAL 1 LANCETS) 26 gauge Misc Fingersticks 4 times daily and as needed.     losartan (COZAAR) 50 mg Oral Tablet Take 0.5 Tablets (25 mg total) by mouth Once a day    magnesium oxide (MAG-OX) 400 mg Oral Tablet Take 1 Tablet (400 mg total) by mouth Once a day    methocarbamoL  (ROBAXIN ) 750 mg Oral Tablet Take 2 Tablets (1,500 mg total) by mouth Three times a day as needed for Other (back pain/muscle spasms)    Pen Needle, Disposable, 32 gauge x 5/32 Needle To use 3 times daily with insulin     sertraline (ZOLOFT) 100 mg Oral Tablet Take 1 Tablet (100 mg total) by mouth Once a day     Social History     Substance and Sexual Activity   Alcohol Use Never     Tobacco Use History[1]    VITAL SIGNS:  Most Recent Vitals    Flowsheet Row Office Visit from 10/01/2024 in End[facility]inology, [facility]   Temperature --   Heart Rate 73 filed at... 10/01/2024 1016   Respiratory Rate 17 filed at... 10/01/2024 1016   BP (Non-Invasive) 138/90 filed at... 10/01/2024 1016   SpO2 94 % filed at... 10/01/2024 1016  Height 1.54 m (5' 0.63) filed at... 10/01/2024 1016   Weight 94.8 kg (209 lb) filed at... 10/01/2024 1016   BMI (Calculated) 40.06 filed at... 10/01/2024 1016   BSA (Calculated) 2.01 filed at... 10/01/2024 1016     Lab Results   Component Value Date    HA1C 7.8 (H) 09/24/2024      - currently not being treated; intolerant to statins    Last Diabetic Eye Exam: 10/16/2024    PATIENT REPORTS:    Monitoring:   Do you test your blood sugar?  Yes  Number of hypoglycemic episodes in the past 2 weeks?  0-patient states that she had a low this morning at 74 mg/dL  Medications:  Do you take your medicine?  Yes  Nutrition:    Do you follow a meal plan to manage diabetes?  No  Patient eats very high carb intake and low protein   Activity:  Do you exercise?  Yes - occasionally does PT exercises    Patient presents with caretaker daughter. Patient has Type 2 diabetes complicated by other comorbid conditions including: hypertension, hyperlipidemia and obesity. Diagnosed with diabetes in 1997, at age 31.  Family history is Positive for Type 2 diabetes; her father. Patient does not like to eat protein. She prefers to eat a high intake of carbohydrates, mainly complex carbs. Daughter cooks for patient and her husband. Daughter does try to encourage patient to eat healthy, decrease portions, and eat more protein. Patient states that she does PT exercises occasionally. She does take her insulin  as prescribed. She uses her Dexcom as prescribed. She has no difficulty getting Dexcom.     ASSESSMENT AND PLAN:     TOPICS DISCUSSED:  Diagnosis of diabetes: difference between type 1 and type 2, causes and need for insulin . Patient and daughter are cognizant of her Type 2 DM diagnosis and the difference from Type 1 DM.   Testing blood glucose: times, targets, recording, when to return for review.  Jeanne [name] currently has a glucometer that they are testing with at home if Dexcom should fail.   A1c: eAG, goal of < 7.0%; most recent A1c 7.8% on 09/24/25  Complications: foot conditions, PAD, dermatologic conditions, ocular conditions, oral conditions, auditory impairment, cardiovascular, nephropathy, neuropathy, sleep apnea, depression, gastroparesis, hepatic conditions (MASH, MASLD). Reviewed care to prevent complications. Patient  does perform foot care at home - follows with podiatry in [place] every 3 months   Hypertension - close to goal, continue management per PCP  Hyperlipidemia - LDL not at goal,  intolerant to statins d/t myalgias  Neuropathy - continue to follow up with Podiatry and perform daily foot checks  Microabuminuria is within goal; continue losartan.  Hyperglycemia: causes, signs/symptoms, treatment, prevention. Patient has post-prandial hyperglycemia. Discussed balance of nutrients to aid in decreasing post-prandial elevations.    Hypoglycemia: causes, signs/symptoms, treatment, prevention, rule of 15, [place]. Patient experiences symptoms of hypoglycemia at 85 mg/dL. Symptoms of hypoglycemia present  during an event: lethargy, sweats and shakes.   Jeanne [name] uses Tresiba  and Humalog  insulin . Continue 26 units Tresiba  daily, continue 10 units with brunch and dinner plus correction scale of Humalog .   Dietary: carbohydrate containing foods, protein containing foods, balance of macronutrients, healthy meals, healthy snacks, appropriate beverages.  Recommended CHO range for Rena K Mancebo: 45 g or less at meals, 15 g or less at snacks.   Recommended  protein range 21-27 g at meals at at least 7 g at snacks  Provided multiple  handouts with snack ideas.   Exercise: recommendations for appropriate exercise. Recommended patient do PT exercises daily.     CGM interpretation 10/18/23 - 10/30/24:   Patient is wearing a Dexcom G7 device.  Average sensor glucose is 219 mg/dL .  Variability 48 mg/dL.  GMI:  8.5%.  Time in range:  26%. Time CGM is active: 26%  Hypoglycemia:  0 episodes noted; low 0% of the time; very low 54mg /dl 0%.  Nocturnal hypoglycemia:  0 noted.  Hyperglycemia:  44% greater than 180mg /dl; 69% >250mg /dl.  Patterns noted include:  elevated post-prandials for extended periods  Recommendations as follows:  add protein to diet  Reviewed CGM trends with patient.  CGM report, including summary, in media.     GOALS  -balance of macronutrients at all meals and snacks (protein and carbohydrates)  -CHO range: 45 g or less at meals, 15 g or less at snacks  -add protein to diet recommended 21-27 g at meals at at least 7 g at snacks  -Recommended patient do PT exercises daily  -Continue 26 units Tresiba  daily  -continue 10 units with brunch and dinner plus correction scale of Humalog .   -follow-up call 4 weeks and if glucose not improved, will increase insulin   -follow-up with RD after follow-up with Levon Connors, APRN 12/30/24 at 11 am     Next scheduled appointment:      Tuesday Dec 30, 2024    Return Patient Visit with Connors Levon, APRN, CNP at 10:40 AM      Endocrinology, [name], [place]  [address]  [place] [address]  [phone]     Elida ALONSO Rives, RD, LD, CDCES  Total time spent with patient 60 minutes Diabetes Education - [phone] - 2 units    The above note will be routed via EPIC to the referring provider.    This note may have been partially generated using M[employer] Fluency Direct system, and there may be some incorrect words, spellings, and punctuation that were not noted in checking the note before saving.          [1]   Social History  Tobacco Use   Smoking Status Never    Passive exposure: Never   Smokeless Tobacco Never

## 2024-12-30 ENCOUNTER — Ambulatory Visit (INDEPENDENT_AMBULATORY_CARE_PROVIDER_SITE_OTHER): Payer: Self-pay | Admitting: NURSE PRACTITIONER

## 2024-12-30 ENCOUNTER — Ambulatory Visit (INDEPENDENT_AMBULATORY_CARE_PROVIDER_SITE_OTHER): Payer: Self-pay
